# Patient Record
Sex: Female | Born: 1980 | Race: White | Hispanic: No | Marital: Married | Smoking: Never smoker
Health system: Southern US, Community
[De-identification: ages and names within clinical notes are randomized; demographics above are authoritative.]

## PROBLEM LIST (undated history)

## (undated) DIAGNOSIS — O09529 Supervision of elderly multigravida, unspecified trimester: Secondary | ICD-10-CM

## (undated) HISTORY — DX: Supervision of elderly multigravida, unspecified trimester: O09.529

---

## 2011-03-29 ENCOUNTER — Inpatient Hospital Stay: Payer: Self-pay | Admitting: Obstetrics and Gynecology

## 2012-08-29 ENCOUNTER — Inpatient Hospital Stay: Payer: Self-pay | Admitting: Obstetrics and Gynecology

## 2012-08-29 LAB — CBC WITH DIFFERENTIAL/PLATELET
Eosinophil #: 0 10*3/uL (ref 0.0–0.7)
HCT: 40.4 % (ref 35.0–47.0)
HGB: 13.6 g/dL (ref 12.0–16.0)
Lymphocyte #: 1.9 10*3/uL (ref 1.0–3.6)
MCHC: 33.6 g/dL (ref 32.0–36.0)
MCV: 86 fL (ref 80–100)
Monocyte %: 5.4 %
Neutrophil #: 9.4 10*3/uL — ABNORMAL HIGH (ref 1.4–6.5)
Neutrophil %: 78.3 %
RBC: 4.73 10*6/uL (ref 3.80–5.20)
RDW: 16.1 % — ABNORMAL HIGH (ref 11.5–14.5)
WBC: 12 10*3/uL — ABNORMAL HIGH (ref 3.6–11.0)

## 2012-08-30 LAB — HEMATOCRIT: HCT: 35.1 % (ref 35.0–47.0)

## 2012-11-06 ENCOUNTER — Emergency Department: Payer: Self-pay | Admitting: Emergency Medicine

## 2012-11-06 LAB — URINALYSIS, COMPLETE
Bilirubin,UR: NEGATIVE
Glucose,UR: NEGATIVE mg/dL (ref 0–75)
Ketone: NEGATIVE
Nitrite: NEGATIVE
Ph: 7 (ref 4.5–8.0)
Specific Gravity: 1.023 (ref 1.003–1.030)
Squamous Epithelial: <1
WBC UR: 20 /HPF (ref 0–5)

## 2012-11-06 LAB — COMPREHENSIVE METABOLIC PANEL
Albumin: 4 g/dL (ref 3.4–5.0)
Alkaline Phosphatase: 118 U/L (ref 50–136)
Anion Gap: 3 — ABNORMAL LOW (ref 7–16)
BUN: 18 mg/dL (ref 7–18)
Chloride: 109 mmol/L — ABNORMAL HIGH (ref 98–107)
Co2: 28 mmol/L (ref 21–32)
EGFR (African American): >60
Osmolality: 280 (ref 275–301)
Potassium: 3.8 mmol/L (ref 3.5–5.1)
SGOT(AST): 40 U/L — ABNORMAL HIGH (ref 15–37)
SGPT (ALT): 77 U/L (ref 12–78)
Sodium: 140 mmol/L (ref 136–145)
Total Protein: 7.8 g/dL (ref 6.4–8.2)

## 2012-11-06 LAB — CBC
HCT: 39.1 % (ref 35.0–47.0)
MCHC: 34.4 g/dL (ref 32.0–36.0)
MCV: 82 fL (ref 80–100)
Platelet: 215 10*3/uL (ref 150–440)
WBC: 6.4 10*3/uL (ref 3.6–11.0)

## 2012-11-06 LAB — CK: CK, Total: 121 U/L (ref 21–215)

## 2015-07-10 HISTORY — PX: WISDOM TOOTH EXTRACTION: SHX21

## 2016-08-27 DIAGNOSIS — R0789 Other chest pain: Secondary | ICD-10-CM | POA: Diagnosis not present

## 2016-08-27 DIAGNOSIS — I10 Essential (primary) hypertension: Secondary | ICD-10-CM | POA: Diagnosis not present

## 2016-08-27 DIAGNOSIS — R202 Paresthesia of skin: Secondary | ICD-10-CM | POA: Diagnosis not present

## 2016-08-27 DIAGNOSIS — Z79899 Other long term (current) drug therapy: Secondary | ICD-10-CM | POA: Diagnosis not present

## 2018-05-19 NOTE — Progress Notes (Signed)
New Obstetric Patient H&P    Chief Complaint: "Desires prenatal care"   History of Present Illness: Patient is a 37 y.o. G3 P2002 White/ Caucasianfemale, with unsure LMP 04/10/2018 presents with amenorrhea and positive home pregnancy test. Based on her  LMP, her EDD is 01/14/2018 and her EGA is 5 weeks 5 days. Cycles are 7 days long, regular, and occur approximately every : 28-30 days. Her last pap smear was 5 or 6 years ago and was negative.   She had a urine pregnancy test which was positive 4 day(s) ago (Oct 8). Her last menstrual period was normal and lasted for  7 day(s). Since her LMP she claims she has experienced nausea. She had some spotting the week before her positive pregnancy test. Her past medical history is uncomplicated. Her prior pregnancies are notable for two spontaneous vaginal deliveries. Pelvis proven to 9#2oz.  Since her LMP, she admits to the use of tobacco products  no She claims she has gained   three pounds since the start of her pregnancy.  There are cats in the home in the home  no If yes NA She admits close contact with children on a regular basis  yes  She has had chicken pox in the past yes She has had Tuberculosis exposures, symptoms, or previously tested positive for TB   no Current or past history of domestic violence. no  Genetic Screening/Teratology Counseling: (Includes patient, baby's father, or anyone in either family with:)   1. Patient's age >/= 67 at [facility]  yes 2. Thalassemia ([place], [place], Mediterranean, or Asian background): MCV<80  no 3. Neural tube defect (meningomyelocele, spina bifida, anencephaly)  no 4. Congenital heart defect  no  5. Down syndrome  no 6. Tay-Sachs (Jewish, [place]  no 7. Canavan's Disease  no 8. Sickle cell disease or trait (African)  no  9. Hemophilia or other blood disorders  no  10. Muscular dystrophy  no  11. Cystic fibrosis  no  12. Huntington's Chorea  no  13. Mental retardation/autism   no 14. Other inherited genetic or chromosomal disorder  no 15. Maternal metabolic disorder (DM, PKU, etc)  no 16. Patient or FOB with a child with a birth defect not listed above no  16a. Patient or FOB with a birth defect themselves no 17. Recurrent pregnancy loss, or stillbirth  no  18. Any medications since LMP other than prenatal vitamins (include vitamins, supplements, OTC meds, drugs, alcohol)  Yes, Juice Plus 19. Any other genetic/environmental exposure to discuss  no  Infection History:   1. Lives with someone with TB or TB exposed  no  2. Patient or partner has history of genital herpes  no 3. Rash or viral illness since LMP  no 4. History of STI (GC, CT, HPV, syphilis, HIV)  no 5. History of recent travel :  Yes, in [place]: [place], [place], [place], [place], [place]  Other pertinent information:  Yes,  Husband Reuel Boom is age 5 and works in [employer] He has twin brothers    Review of Systems:10 point review of systems negative unless otherwise noted in HPI  Past Medical History:  Past Medical History:  Diagnosis Date  . Advanced maternal age in multigravida     Past Surgical History:  Past Surgical History:  Procedure Laterality Date  . WISDOM TOOTH EXTRACTION  2017   All four;      Gynecologic History: Patient's last menstrual period was 04/10/2018 (approximate).  Obstetric History:  OB History  Gravida Para Term Preterm AB Living  3 2 2     2   SAB TAB Ectopic Multiple Live Births          2    # Outcome Date GA Lbr Len/2nd Weight Sex Delivery Anes PTL Lv  3 Current           2 Term 08/29/12 [redacted]w[redacted]d  9 lb 2 oz (4.139 kg) M Vag-Spont   LIV  1 Term 03/30/11 [redacted]w[redacted]d  7 lb 12 oz (3.515 kg) F Vag-Spont   LIV    Family History:  Family History  Problem Relation Age of Onset  . Diabetes Father   . Heart disease Father   . Heart disease Paternal Grandfather     Social History:  Social History   Socioeconomic History  . Marital status: Married    Spouse  name: Not on file  . Number of children: 2  . Years of education: 46  . Highest education level: Not on file  Occupational History  . Occupation: Artist  . Occupation: Building services engineer in [place]  Social Needs  . Financial resource strain: Not on file  . Food insecurity:    Worry: Not on file    Inability: Not on file  . Transportation needs:    Medical: Not on file    Non-medical: Not on file  Tobacco Use  . Smoking status: Never Smoker  . Smokeless tobacco: Never Used  Substance and Sexual Activity  . Alcohol use: Not Currently    Comment: occ wine  . Drug use: Never  . Sexual activity: Yes    Birth control/protection: None  Lifestyle  . Physical activity:    Days per week: Not on file    Minutes per session: Not on file  . Stress: Not on file  Relationships  . Social connections:    Talks on phone: Not on file    Gets together: Not on file    Attends religious service: Not on file    Active member of club or organization: Not on file    Attends meetings of clubs or organizations: Not on file    Relationship status: Not on file  . Intimate partner violence:    Fear of current or ex partner: Not on file    Emotionally abused: Not on file    Physically abused: Not on file    Forced sexual activity: Not on file  Other Topics Concern  . Not on file  Social History Narrative  . Not on file    Allergies:  No Known Allergies  Medications: Juice Plus supplements Physical Exam Vitals: BP 100/70   Ht 5\' 8"  (1.727 m)   Wt 167 lb 12 oz (76.1 kg)   LMP 04/10/2018 (Approximate)   BMI 25.51 kg/m   General: WF in NAD HEENT: normocephalic, anicteric  Eyes: PEARL  Mouth: moist, no lesions  Throat: no exudates or inflammation Thyroid: no enlargement, no palpable nodules Pulmonary: No increased work of breathing, CTAB Cardiovascular: RRR, without murmur Abdomen: soft, non-tender, non-distended.  Umbilicus without lesions.  No hepatomegaly or masses  palpable. No evidence of hernia  Genitourinary:  External: Normal external female genitalia.  Normal urethral meatus, normal Bartholin's and Skene's glands.    Vagina: Normal vaginal mucosa, no evidence of prolapse.    Cervix: Grossly normal in appearance, no bleeding  Uterus: AF, globular fundus, mobile, normal contour.  Adnexa: ovaries non-enlarged, no adnexal masses  Rectal: deferred Extremities: no  edema, erythema, or tenderness Neurologic: Grossly intact Psychiatric: mood appropriate, affect full   Assessment: 37 y.o. G3 P2002 presents to initiate prenatal care Early pregnancy at 5wk5d by LMP AMA  Plan: 1) Avoid alcoholic beverages. 2) Patient encouraged not to smoke.  3) Discontinue the use of all non-medicinal drugs and chemicals.  4) Can continue to take Juice Plus supplements  5) Nutrition, food safety (fish, cheese advisories, and high nitrite foods) and exercise discussed. Handouts given on genetic testing, safe medications in pregnancy, diet and food safety.  6) Hospital and practice style discussed with cross coverage system.  7) Her AMA increased risk of chromosomal abnormalities explained. Genetic Screening, such as with 1st Trimester Screening, cell free fetal DNA, AFP testing, and Ultrasound, as well as with amniocentesis, is discussed with patient. At the conclusion of today's visit patient is undecided about genetic testing 8) Patient is asked about travel to areas at risk for the Zika virus, and she has not traveled to high risk area. 9) RTO in 1-2 weeks for ultrasound for dating. 10) NOB labs, urine culture, UDS 7, Pap smear with [facility]ptima today.  Farrel Conners, CNM

## 2018-05-20 ENCOUNTER — Ambulatory Visit (INDEPENDENT_AMBULATORY_CARE_PROVIDER_SITE_OTHER): Payer: BLUE CROSS/BLUE SHIELD | Admitting: Certified Nurse Midwife

## 2018-05-20 ENCOUNTER — Other Ambulatory Visit (HOSPITAL_COMMUNITY)
Admission: RE | Admit: 2018-05-20 | Discharge: 2018-05-20 | Disposition: A | Discharge status: Home / Self Care | Payer: BLUE CROSS/BLUE SHIELD | Source: Ambulatory Visit | Attending: Certified Nurse Midwife | Admitting: Certified Nurse Midwife

## 2018-05-20 ENCOUNTER — Encounter: Payer: Self-pay | Admitting: Certified Nurse Midwife

## 2018-05-20 VITALS — BP 100/70 | Ht 68.0 in | Wt 167.8 lb

## 2018-05-20 DIAGNOSIS — O0991 Supervision of high risk pregnancy, unspecified, first trimester: Secondary | ICD-10-CM | POA: Diagnosis not present

## 2018-05-20 DIAGNOSIS — Z3A12 12 weeks gestation of pregnancy: Secondary | ICD-10-CM | POA: Diagnosis not present

## 2018-05-20 DIAGNOSIS — Z113 Encounter for screening for infections with a predominantly sexual mode of transmission: Secondary | ICD-10-CM | POA: Insufficient documentation

## 2018-05-20 DIAGNOSIS — Z3A01 Less than 8 weeks gestation of pregnancy: Secondary | ICD-10-CM

## 2018-05-20 DIAGNOSIS — O09521 Supervision of elderly multigravida, first trimester: Secondary | ICD-10-CM

## 2018-05-20 DIAGNOSIS — Z124 Encounter for screening for malignant neoplasm of cervix: Secondary | ICD-10-CM | POA: Insufficient documentation

## 2018-05-20 DIAGNOSIS — O0990 Supervision of high risk pregnancy, unspecified, unspecified trimester: Secondary | ICD-10-CM

## 2018-05-20 DIAGNOSIS — O09529 Supervision of elderly multigravida, unspecified trimester: Secondary | ICD-10-CM | POA: Diagnosis not present

## 2018-05-20 DIAGNOSIS — Z3687 Encounter for antenatal screening for uncertain dates: Secondary | ICD-10-CM

## 2018-05-20 LAB — POCT URINALYSIS DIPSTICK OB
GLUCOSE, UA: NEGATIVE
POC,PROTEIN,UA: NEGATIVE

## 2018-05-20 NOTE — Progress Notes (Signed)
C/O last week had a brownish something as big as the end of a thumb - ?tissue or part of tampon?Ernest Mallick.rj

## 2018-05-21 ENCOUNTER — Encounter: Payer: Self-pay | Admitting: Certified Nurse Midwife

## 2018-05-21 DIAGNOSIS — O09529 Supervision of elderly multigravida, unspecified trimester: Secondary | ICD-10-CM | POA: Insufficient documentation

## 2018-05-21 DIAGNOSIS — O0990 Supervision of high risk pregnancy, unspecified, unspecified trimester: Secondary | ICD-10-CM | POA: Insufficient documentation

## 2018-05-21 LAB — URINE DRUG PANEL 7
Amphetamines, Urine: NEGATIVE ng/mL
Barbiturate Quant, Ur: NEGATIVE ng/mL
Benzodiazepine Quant, Ur: NEGATIVE ng/mL
Cannabinoid Quant, Ur: NEGATIVE ng/mL
Cocaine (Metab.): NEGATIVE ng/mL
Opiate Quant, Ur: NEGATIVE ng/mL
PCP QUANT UR: NEGATIVE ng/mL

## 2018-05-21 LAB — RPR+RH+ABO+RUB AB+AB SCR+CB...
ANTIBODY SCREEN: NEGATIVE
HIV Screen 4th Generation wRfx: NONREACTIVE
Hematocrit: 44.1 % (ref 34.0–46.6)
Hemoglobin: 14.5 g/dL (ref 11.1–15.9)
Hepatitis B Surface Ag: NEGATIVE
MCH: 29.5 pg (ref 26.6–33.0)
MCHC: 32.9 g/dL (ref 31.5–35.7)
MCV: 90 fL (ref 79–97)
Platelets: 229 10*3/uL (ref 150–450)
RBC: 4.91 x10E6/uL (ref 3.77–5.28)
RDW: 12.3 % (ref 12.3–15.4)
RPR Ser Ql: NONREACTIVE
RUBELLA: 1.98 {index} (ref >0.99)
Rh Factor: POSITIVE
Varicella zoster IgG: 233 index (ref >165)
WBC: 6.3 10*3/uL (ref 3.4–10.8)

## 2018-05-22 LAB — CYTOLOGY - PAP
ADEQUACY: ABSENT
Chlamydia: NEGATIVE
DIAGNOSIS: NEGATIVE
HPV (WINDOPATH): NOT DETECTED
Neisseria Gonorrhea: NEGATIVE

## 2018-05-22 LAB — URINE CULTURE

## 2018-06-04 ENCOUNTER — Ambulatory Visit (INDEPENDENT_AMBULATORY_CARE_PROVIDER_SITE_OTHER): Payer: BLUE CROSS/BLUE SHIELD

## 2018-06-04 ENCOUNTER — Encounter: Payer: Self-pay | Admitting: Advanced Practice Midwife

## 2018-06-04 ENCOUNTER — Ambulatory Visit (INDEPENDENT_AMBULATORY_CARE_PROVIDER_SITE_OTHER): Payer: BLUE CROSS/BLUE SHIELD | Admitting: Advanced Practice Midwife

## 2018-06-04 VITALS — BP 108/70 | Wt 169.0 lb

## 2018-06-04 DIAGNOSIS — Z3A08 8 weeks gestation of pregnancy: Secondary | ICD-10-CM | POA: Diagnosis not present

## 2018-06-04 DIAGNOSIS — N8312 Corpus luteum cyst of left ovary: Secondary | ICD-10-CM | POA: Diagnosis not present

## 2018-06-04 DIAGNOSIS — Z3687 Encounter for antenatal screening for uncertain dates: Secondary | ICD-10-CM

## 2018-06-04 DIAGNOSIS — O09529 Supervision of elderly multigravida, unspecified trimester: Secondary | ICD-10-CM

## 2018-06-04 DIAGNOSIS — O0990 Supervision of high risk pregnancy, unspecified, unspecified trimester: Secondary | ICD-10-CM

## 2018-06-04 DIAGNOSIS — O09521 Supervision of elderly multigravida, first trimester: Secondary | ICD-10-CM

## 2018-06-04 DIAGNOSIS — O3411 Maternal care for benign tumor of corpus uteri, first trimester: Secondary | ICD-10-CM | POA: Diagnosis not present

## 2018-06-04 DIAGNOSIS — Z3A01 Less than 8 weeks gestation of pregnancy: Secondary | ICD-10-CM

## 2018-06-04 LAB — POCT URINALYSIS DIPSTICK OB
GLUCOSE, UA: NEGATIVE
POC,PROTEIN,UA: NEGATIVE

## 2018-06-04 NOTE — Progress Notes (Signed)
  Routine Prenatal Care Visit  Subjective  Karla Moody is a 37 y.o. G3P2002 at 7114w4d being seen today for ongoing prenatal care.  She is currently monitored for the following issues for this high-risk pregnancy and has Advanced maternal age in multigravida and Supervision of high risk pregnancy, antepartum on their problem list.  ----------------------------------------------------------------------------------- Patient reports no complaints.    . Vag. Bleeding: None.   . Denies leaking of fluid.  ----------------------------------------------------------------------------------- The following portions of the patient's history were reviewed and updated as appropriate: allergies, current medications, past family history, past medical history, past social history, past surgical history and problem list. Problem list updated.   Objective  Blood pressure 108/70, weight 169 lb (76.7 kg), last menstrual period 04/10/2018. Pregravid weight 160 lb (72.6 kg) Total Weight Gain 9 lb (4.082 kg) Urinalysis: Urine Protein Negative  Urine Glucose Negative  Fetal Status: Fetal Heart Rate (bpm): 196         Dating scan today 8 weeks 4 days. Adjust EDD to ultrasound dating for uncertain LMP  General:  Alert, oriented and cooperative. Patient is in no acute distress.  Skin: Skin is warm and dry. No rash noted.   Cardiovascular: Normal heart rate noted  Respiratory: Normal respiratory effort, no problems with respiration noted  Abdomen: Soft, gravid, appropriate for gestational age.       Pelvic:  Cervical exam deferred        Extremities: Normal range of motion.     Mental Status: Normal mood and affect. Normal behavior. Normal judgment and thought content.   Assessment   37 y.o. [id number] at 8014w4d by  01/10/2019, by Ultrasound presenting for routine prenatal visit  Plan   THIRD Problems (from 05/20/18 to present)    No problems associated with this episode.       Preterm labor symptoms and  general obstetric precautions including but not limited to vaginal bleeding, contractions, leaking of fluid and fetal movement were reviewed in detail with the patient.    Return in about 4 weeks (around 07/02/2018) for 1st trimester screen/NT scan/rob.  Tresea MallJane Kwasi Moody, CNM 06/04/2018 11:48 AM

## 2018-06-04 NOTE — Progress Notes (Signed)
ROB/Dating C/o some nausea   

## 2018-06-23 ENCOUNTER — Ambulatory Visit (INDEPENDENT_AMBULATORY_CARE_PROVIDER_SITE_OTHER): Payer: BLUE CROSS/BLUE SHIELD | Admitting: Obstetrics & Gynecology

## 2018-06-23 ENCOUNTER — Encounter: Payer: Self-pay | Admitting: Obstetrics & Gynecology

## 2018-06-23 VITALS — BP 110/80 | Wt 167.0 lb

## 2018-06-23 DIAGNOSIS — O209 Hemorrhage in early pregnancy, unspecified: Secondary | ICD-10-CM

## 2018-06-23 NOTE — Progress Notes (Signed)
Obstetric Problem Visit   Chief Complaint:  Chief Complaint  Patient presents with  . Vaginal Discharge    History of Present Illness: Patient is a 37 y.o. [id number] 4087w2d presenting for first trimester bleeding.  The onset of bleeding was one week ago and it has been more brown discharge than red.  Mild cramping.  SHe is 11 weeks by earlier US dating.  Third pregnancy, no prior h/o PTL, bleeding, other.  Is bleeding equal to or greater than normal menstrual flow:  No Any recent trauma:  No Recent intercourse:  No History of prior miscarriage:  No Prior ultrasound demonstrating IUP:  Yes Prior ultrasound demonstrating viable IUP:  Yes Prior Serum HCG:  No Rh status: O+  PMHx: She  has a past medical history of Advanced maternal age in multigravida. Also,  has a past surgical history that includes Wisdom tooth extraction (2017)., family history includes Diabetes in her father; Heart disease in her father and paternal grandfather; Multiple sclerosis in her paternal aunt.,  reports that she has never smoked. She has never used smokeless tobacco. She reports previous alcohol use. She reports that she does not use drugs.  She has a current medication list which includes the following prescription(s): nutritional supplements. Also, has No Known Allergies.  Review of Systems  Constitutional: Negative for chills, fever and malaise/fatigue.  HENT: Negative for congestion, sinus pain and sore throat.   Eyes: Negative for blurred vision and pain.  Respiratory: Negative for cough and wheezing.   Cardiovascular: Negative for chest pain and leg swelling.  Gastrointestinal: Negative for abdominal pain, constipation, diarrhea, heartburn, nausea and vomiting.  Genitourinary: Negative for dysuria, frequency, hematuria and urgency.  Musculoskeletal: Negative for back pain, joint pain, myalgias and neck pain.  Skin: Negative for itching and rash.  Neurological: Negative for dizziness, tremors and  weakness.  Endo/Heme/Allergies: Does not bruise/bleed easily.  Psychiatric/Behavioral: Negative for depression. The patient is not nervous/anxious and does not have insomnia.     Objective: Vitals:   06/23/18 1521  BP: 110/80   Physical Exam Constitutional:      General: She is not in acute distress.    Appearance: She is well-developed.  Genitourinary:     Pelvic exam was performed with patient supine.     Vagina and uterus normal.     No vaginal erythema or bleeding.     No cervical motion tenderness, discharge, polyp or nabothian cyst.     Uterus is mobile.     Uterus is not enlarged.     No uterine mass detected.    Uterus is midaxial.     No right or left adnexal mass present.     Right adnexa not tender.     Left adnexa not tender.     Genitourinary Comments: Brown discharge, c/w old blood not infection Cervix closed, thick    No lesions Uterus enlarged and anteverted  HENT:     Head: Normocephalic and atraumatic.     Nose: Nose normal.  Abdominal:     General: There is no distension.     Palpations: Abdomen is soft.     Tenderness: There is no abdominal tenderness.  Musculoskeletal: Normal range of motion.  Neurological:     Mental Status: She is alert and oriented to person, place, and time.     Cranial Nerves: No cranial nerve deficit.  Skin:    General: Skin is warm and dry.   US- +FHT 160s   No funnelling  Breech, singleton   No mass  Assessment & Plan: 37 y.o. [id number] [redacted]w[redacted]d Problem List Items Addressed This Visit      Other   First trimester bleeding - Primary    More brown than bleeding, no sign of infection or other etiology Exam reassuring, cervix closed [place] (by self) w FHT 150s and no funnelling, good FM, singleton  The patient is Rh O+, rhogam is therefore not indicated to decrease the risk rhesus alloimmunization.    Routine bleeding precautions were discussed with the patient prior the conclusion of today's visit.  Annamarie Major, MD,  [facility], [facility] 06/23/2018  4:08 PM

## 2018-07-04 ENCOUNTER — Ambulatory Visit (INDEPENDENT_AMBULATORY_CARE_PROVIDER_SITE_OTHER): Payer: BLUE CROSS/BLUE SHIELD | Admitting: Advanced Practice Midwife

## 2018-07-04 ENCOUNTER — Ambulatory Visit (INDEPENDENT_AMBULATORY_CARE_PROVIDER_SITE_OTHER): Payer: BLUE CROSS/BLUE SHIELD

## 2018-07-04 ENCOUNTER — Encounter: Payer: Self-pay | Admitting: Advanced Practice Midwife

## 2018-07-04 VITALS — BP 114/70 | Wt 168.0 lb

## 2018-07-04 DIAGNOSIS — O0990 Supervision of high risk pregnancy, unspecified, unspecified trimester: Secondary | ICD-10-CM

## 2018-07-04 DIAGNOSIS — Z3682 Encounter for antenatal screening for nuchal translucency: Secondary | ICD-10-CM

## 2018-07-04 DIAGNOSIS — O09529 Supervision of elderly multigravida, unspecified trimester: Secondary | ICD-10-CM | POA: Diagnosis not present

## 2018-07-04 DIAGNOSIS — O09521 Supervision of elderly multigravida, first trimester: Secondary | ICD-10-CM

## 2018-07-04 DIAGNOSIS — O0991 Supervision of high risk pregnancy, unspecified, first trimester: Secondary | ICD-10-CM

## 2018-07-04 DIAGNOSIS — Z3A12 12 weeks gestation of pregnancy: Secondary | ICD-10-CM

## 2018-07-04 NOTE — Progress Notes (Signed)
NT screen. No vb. No lof.

## 2018-07-04 NOTE — Patient Instructions (Signed)

## 2018-07-04 NOTE — Progress Notes (Signed)
  Routine Prenatal Care Visit  Subjective  Karla Moody is a 37 y.o. G3P2002 at 2275w6d being seen today for ongoing prenatal care.  She is currently monitored for the following issues for this high-risk pregnancy and has Advanced maternal age in multigravida; Supervision of high risk pregnancy, antepartum; and First trimester bleeding on their problem list.  ----------------------------------------------------------------------------------- Patient reports no complaints.    . Vag. Bleeding: None.   . Denies leaking of fluid.  ----------------------------------------------------------------------------------- The following portions of the patient's history were reviewed and updated as appropriate: allergies, current medications, past family history, past medical history, past social history, past surgical history and problem list. Problem list updated.   Objective  Blood pressure 114/70, weight 168 lb (76.2 kg), last menstrual period 04/10/2018. Pregravid weight 160 lb (72.6 kg) Total Weight Gain 8 lb (3.629 kg) Urinalysis: Urine Protein    Urine Glucose    Fetal Status: Fetal Heart Rate (bpm): 154          ULTRASOUND REPORT  Location: [facility]        Date of Service: 07/04/2018  Patient Name: Karla Moody DOB: Apr 11, 1981 MRN: [id number]  Indications:First Trimester Screen - NT Findings:  Karla Moody intrauterine pregnancy is visualized with a CRL consistent with 3336w3d gestation, giving an (U/S) EDD of 01/13/2019. The (U/S) EDD is consistent with the clinically established Estimated Date of Delivery: 01/10/19.  FHR: 154 BPM CRL measurement: 58.8 mm NT measurement: unable to be assessed due to fetal position but thick area around the fetus neck is observed. Yolk sac is not seen. Amnion is visualized.  Nasal Bone is not seen due to fetal position  Right Ovary is normal in appearance. Left Ovary is normal in appearance. Survey of the adnexa demonstrates no adnexal  masses. There is no free peritoneal fluid in the cul de sac.  Impression: 1. 7136w3d viable Singleton Intrauterine pregnancy by U/S. 2. (U/S) EDD is consistent with Clinically established Estimated Date of Delivery: 01/10/19. 3. NT Screen is not successfully completed  but thick area around the fetus neck is observed.  Recommendations: 1.Clinical correlation with the patient's History and Physical Exam. 2. NT screening in 4 to 5 days.  Mital bahen Leodis BinetP Patel, RDMS  General:  Alert, oriented and cooperative. Patient is in no acute distress.  Skin: Skin is warm and dry. No rash noted.   Cardiovascular: Normal heart rate noted  Respiratory: Normal respiratory effort, no problems with respiration noted  Abdomen: Soft, gravid, appropriate for gestational age. Pain/Pressure: Absent     Pelvic:  Cervical exam deferred        Extremities: Normal range of motion.     Mental Status: Normal mood and affect. Normal behavior. Normal judgment and thought content.   Assessment   37 y.o. [id number] at 3975w6d by  01/10/2019, by Ultrasound presenting for routine prenatal visit  Plan   THIRD Problems (from 05/20/18 to present)    No problems associated with this episode.    MaterniT21 done today   Preterm labor symptoms and general obstetric precautions including but not limited to vaginal bleeding, contractions, leaking of fluid and fetal movement were reviewed in detail with the patient. Please refer to After Visit Summary for other counseling recommendations.   Return in about 4 weeks (around 08/01/2018) for rob.  Karla Moody, CNM 07/04/2018 9:52 AM

## 2018-07-10 LAB — MATERNIT 21 PLUS CORE, BLOOD
CHROMOSOME 13: POSITIVE — AB
CHROMOSOME 18: NEGATIVE
CHROMOSOME 21: NEGATIVE
Fetal Fraction: 6
Y Chromosome: NOT DETECTED

## 2018-07-15 ENCOUNTER — Other Ambulatory Visit: Payer: Self-pay | Admitting: Advanced Practice Midwife

## 2018-07-15 DIAGNOSIS — O285 Abnormal chromosomal and genetic finding on antenatal screening of mother: Secondary | ICD-10-CM

## 2018-07-16 ENCOUNTER — Emergency Department: Payer: BLUE CROSS/BLUE SHIELD

## 2018-07-16 ENCOUNTER — Emergency Department
Admission: EM | Admit: 2018-07-16 | Discharge: 2018-07-17 | Disposition: A | Discharge status: Home / Self Care | Payer: BLUE CROSS/BLUE SHIELD | Attending: Emergency Medicine | Admitting: Emergency Medicine

## 2018-07-16 ENCOUNTER — Encounter: Payer: Self-pay | Admitting: Emergency Medicine

## 2018-07-16 DIAGNOSIS — O4691 Antepartum hemorrhage, unspecified, first trimester: Secondary | ICD-10-CM | POA: Insufficient documentation

## 2018-07-16 DIAGNOSIS — O208 Other hemorrhage in early pregnancy: Secondary | ICD-10-CM | POA: Diagnosis not present

## 2018-07-16 DIAGNOSIS — Z3A11 11 weeks gestation of pregnancy: Secondary | ICD-10-CM | POA: Diagnosis not present

## 2018-07-16 DIAGNOSIS — N939 Abnormal uterine and vaginal bleeding, unspecified: Secondary | ICD-10-CM

## 2018-07-16 DIAGNOSIS — O26891 Other specified pregnancy related conditions, first trimester: Secondary | ICD-10-CM | POA: Diagnosis not present

## 2018-07-16 DIAGNOSIS — O209 Hemorrhage in early pregnancy, unspecified: Secondary | ICD-10-CM | POA: Diagnosis not present

## 2018-07-16 DIAGNOSIS — O4690 Antepartum hemorrhage, unspecified, unspecified trimester: Secondary | ICD-10-CM

## 2018-07-16 LAB — CBC WITH DIFFERENTIAL/PLATELET
Abs Immature Granulocytes: 0.05 10*3/uL (ref 0.00–0.07)
Basophils Absolute: 0 10*3/uL (ref 0.0–0.1)
Basophils Relative: 0 %
Eosinophils Absolute: 0 10*3/uL (ref 0.0–0.5)
Eosinophils Relative: 0 %
HEMATOCRIT: 44.1 % (ref 36.0–46.0)
Hemoglobin: 14.7 g/dL (ref 12.0–15.0)
Immature Granulocytes: 0 %
Lymphocytes Relative: 17 %
Lymphs Abs: 2.1 10*3/uL (ref 0.7–4.0)
MCH: 29.1 pg (ref 26.0–34.0)
MCHC: 33.3 g/dL (ref 30.0–36.0)
MCV: 87.2 fL (ref 80.0–100.0)
Monocytes Absolute: 0.6 10*3/uL (ref 0.1–1.0)
Monocytes Relative: 5 %
NEUTROS PCT: 78 %
Neutro Abs: 9.2 10*3/uL — ABNORMAL HIGH (ref 1.7–7.7)
Platelets: 248 10*3/uL (ref 150–400)
RBC: 5.06 MIL/uL (ref 3.87–5.11)
RDW: 12.9 % (ref 11.5–15.5)
WBC: 12 10*3/uL — ABNORMAL HIGH (ref 4.0–10.5)
nRBC: 0 % (ref 0.0–0.2)

## 2018-07-16 LAB — URINALYSIS, COMPLETE (UACMP) WITH MICROSCOPIC
Bacteria, UA: NONE SEEN
Bilirubin Urine: NEGATIVE
Glucose, UA: NEGATIVE mg/dL
Hgb urine dipstick: NEGATIVE
Ketones, ur: 80 mg/dL — AB
Leukocytes, UA: NEGATIVE
Nitrite: NEGATIVE
Protein, ur: NEGATIVE mg/dL
Specific Gravity, Urine: 1.021 (ref 1.005–1.030)
pH: 6 (ref 5.0–8.0)

## 2018-07-16 LAB — HCG, QUANTITATIVE, PREGNANCY: hCG, Beta Chain, Quant, S: 5284 m[IU]/mL — ABNORMAL HIGH (ref <5)

## 2018-07-16 LAB — CHLAMYDIA/NGC RT PCR (ARMC ONLY)
CHLAMYDIA TR: NOT DETECTED
N gonorrhoeae: NOT DETECTED

## 2018-07-16 LAB — WET PREP, GENITAL
Clue Cells Wet Prep HPF POC: NONE SEEN
Sperm: NONE SEEN
Trich, Wet Prep: NONE SEEN
Yeast Wet Prep HPF POC: NONE SEEN

## 2018-07-16 NOTE — ED Triage Notes (Addendum)
Patient presents to the ED with lower abdominal cramping that began at 5:30pm and patient states when she went to the bathroom she noticed some vaginal bleeding and a small blood clot.  Patient states bleeding has subsided some.  Patient is tearful during triage.  This is patient's 3rd pregnancy.  Full term natural deliveries without complications for the first 2 pregnancies.

## 2018-07-16 NOTE — ED Provider Notes (Signed)
[facility] Emergency Department Provider Note  ____________________________________________   First MD Initiated Contact with Patient 07/16/18 2111     (approximate)  I have reviewed the triage vital signs and the nursing notes.   HISTORY  Chief Complaint Vaginal Bleeding    HPI Karla Moody is a 38 y.o. female with first trimester bleeding this past December who was presented emergency department at [redacted] weeks gestation with bright red blood per vagina as well as lower abdominal cramping since approximately 5:15 PM this afternoon.  She says that the cramping at this time is a 3-4 out of 10.  States that she had clots earlier but now is just having liquid blood.  Patient is a G3 P2 without issue with her previous pregnancies.  Has had 2 previous vaginal deliveries.   Past Medical History:  Diagnosis Date  . Advanced maternal age in multigravida     Patient Active Problem List   Diagnosis Date Noted  . First trimester bleeding 06/23/2018  . Supervision of high risk pregnancy, antepartum 05/21/2018  . Advanced maternal age in multigravida     Past Surgical History:  Procedure Laterality Date  . WISDOM TOOTH EXTRACTION  2017   All four;      Prior to Admission medications   Medication Sig Start Date End Date Taking? Authorizing Provider  Nutritional Supplements (JUICE PLUS FIBRE PO) Take by mouth.    [provider]    Allergies Patient has no known allergies.  Family History  Problem Relation Age of Onset  . Diabetes Father   . Heart disease Father        sudden death  . Heart disease Paternal Grandfather   . Multiple sclerosis Paternal Aunt     Social History Social History   Tobacco Use  . Smoking status: Never Smoker  . Smokeless tobacco: Never Used  Substance Use Topics  . Alcohol use: Not Currently    Comment: occ wine  . Drug use: Never    Review of Systems  Constitutional: No fever/chills Eyes: No visual  changes. ENT: No sore throat. Cardiovascular: Denies chest pain. Respiratory: Denies shortness of breath. Gastrointestinal: No nausea, no vomiting.  No diarrhea.  No constipation. Genitourinary: Negative for dysuria. Musculoskeletal: Negative for back pain. Skin: Negative for rash. Neurological: Negative for headaches, focal weakness or numbness.   ____________________________________________   PHYSICAL EXAM:  VITAL SIGNS: ED Triage Vitals  Enc Vitals Group     BP 07/16/18 1830 133/84     Pulse Rate 07/16/18 1830 73     Resp 07/16/18 1830 20     Temp 07/16/18 1830 98.3 F (36.8 C)     Temp Source 07/16/18 1830 Oral     SpO2 07/16/18 1830 100 %     Weight 07/16/18 1830 161 lb (73 kg)     Height 07/16/18 1830 5\' 8"  (1.727 m)     Head Circumference --      Peak Flow --      Pain Score 07/16/18 1840 1     Pain Loc --      Pain Edu? --      Excl. in GC? --     Constitutional: Alert and oriented. Well appearing and in no acute distress. Eyes: Conjunctivae are normal.  Head: Atraumatic. Nose: No congestion/rhinnorhea. Mouth/Throat: Mucous membranes are moist.  Neck: No stridor.   Cardiovascular: Normal rate, regular rhythm. Grossly normal heart sounds.  Good peripheral circulation. Respiratory: Normal respiratory effort.  No  retractions. Lungs CTAB. Gastrointestinal: Soft and nontender. No distention. No CVA tenderness. Genitourinary: Normal external exam without any lesions.  Speculum exam with small amount of pooling dark maroon blood but without active bleeding.  Difficult bimanual exam.  The patient had told me that she was very far either anterior or posterior but could not remember which one.  I believe her cervix was very posterior.  The cervix was not effaced.  Because of the position of the cervix I was unable to palpate the OS.  Mild tenderness to palpation to the uterus as well as the bilateral adnexa. Musculoskeletal: No lower extremity tenderness nor edema.  No  joint effusions. Neurologic:  Normal speech and language. No gross focal neurologic deficits are appreciated. Skin:  Skin is warm, dry and intact. No rash noted. Psychiatric: Mood and affect are normal. Speech and behavior are normal.  ____________________________________________   LABS (all labs ordered are listed, but only abnormal results are displayed)  Labs Reviewed  WET PREP, GENITAL - Abnormal; Notable for the following components:      Result Value   WBC, Wet Prep HPF POC FEW (*)    All other components within normal limits  HCG, QUANTITATIVE, PREGNANCY - Abnormal; Notable for the following components:   hCG, Beta Chain, Quant, S 5,284 (*)    All other components within normal limits  CBC WITH DIFFERENTIAL/PLATELET - Abnormal; Notable for the following components:   WBC 12.0 (*)    Neutro Abs 9.2 (*)    All other components within normal limits  URINALYSIS, COMPLETE (UACMP) WITH MICROSCOPIC - Abnormal; Notable for the following components:   Color, Urine YELLOW (*)    APPearance HAZY (*)    Ketones, ur 80 (*)    All other components within normal limits  CHLAMYDIA/NGC RT PCR ([facility] ONLY)   ____________________________________________  EKG   ____________________________________________  RADIOLOGY  Pending ultrasound read ____________________________________________   PROCEDURES  Procedure(s) performed:   Procedures  Critical Care performed:   ____________________________________________   INITIAL IMPRESSION / ASSESSMENT AND PLAN / ED COURSE  Pertinent labs & imaging results that were available during my care of the patient were reviewed by me and considered in my medical decision making (see chart for details).  Differential diagnosis includes, but is not limited to, threatened miscarriage, incomplete miscarriage, normal bleeding from an early trimester pregnancy, ectopic pregnancy, , blighted ovum, vaginal/cervical trauma, subchorionic  hemorrhage/hematoma, etc. As part of my medical decision making, I reviewed the following data within the electronic MEDICAL RECORD NUMBER Notes from prior OB/GYN visits  ----------------------------------------- 11:17 PM on 07/16/2018 -----------------------------------------  Pending official ultrasound read at this time.  Patient did not report to me but had reported to the ultrasound tech that there was suspicion of trisomy 71.  Signed out to Dr. Manson Passey. ____________________________________________   FINAL CLINICAL IMPRESSION(S) / ED DIAGNOSES  Final diagnoses:  Vaginal bleeding  Second trimester vaginal bleeding.    NEW MEDICATIONS STARTED DURING THIS VISIT:  New Prescriptions   No medications on file     Note:  This document was prepared using Dragon voice recognition software and may include unintentional dictation errors.     Myrna Blazer, MD 07/16/18 [phone]

## 2018-07-16 NOTE — ED Notes (Signed)
This RN attempted to assess patient's fetal heart tones without success.  Also, patient states she has an O+ blood type.

## 2018-07-17 ENCOUNTER — Ambulatory Visit (INDEPENDENT_AMBULATORY_CARE_PROVIDER_SITE_OTHER): Payer: BLUE CROSS/BLUE SHIELD | Admitting: Obstetrics and Gynecology

## 2018-07-17 ENCOUNTER — Encounter: Payer: Self-pay | Admitting: Obstetrics and Gynecology

## 2018-07-17 VITALS — BP 100/60 | Wt 164.0 lb

## 2018-07-17 DIAGNOSIS — O09529 Supervision of elderly multigravida, unspecified trimester: Secondary | ICD-10-CM

## 2018-07-17 DIAGNOSIS — O021 Missed abortion: Secondary | ICD-10-CM

## 2018-07-17 NOTE — Progress Notes (Signed)
   Patient ID: Karla Moody, female   DOB: [birth year], 38 y.o.   MRN: [id number]  Reason for Consult: Follow-up (no bleeding or pain today, heavy bleeding with clots & cramps yesterday) and Vaginal Bleeding   Referred by No ref. provider found  Subjective:     HPI:  Karla Moody is a 38 y.o. female. She is following up today in the office because she was seen in the ER yesterday and diagnosed with a missed abortion. She recently had a positive Trisomy 13 screen. This was an add on problem visit scheduled this morning.   Past Medical History:  Diagnosis Date  . Advanced maternal age in multigravida    Family History  Problem Relation Age of Onset  . Diabetes Father   . Heart disease Father        sudden death  . Heart disease Paternal Grandfather   . Multiple sclerosis Paternal Aunt    Past Surgical History:  Procedure Laterality Date  . WISDOM TOOTH EXTRACTION  2017   All four;      Short Social History:  Social History   Tobacco Use  . Smoking status: Never Smoker  . Smokeless tobacco: Never Used  Substance Use Topics  . Alcohol use: Not Currently    Comment: occ wine    No Known Allergies  Current Outpatient Medications  Medication Sig Dispense Refill  . Nutritional Supplements (JUICE PLUS FIBRE PO) Take by mouth.     No current facility-administered medications for this visit.     REVIEW OF SYSTEMS      Objective:  Objective   Vitals:   07/17/18 1420  BP: 100/60  Weight: 164 lb (74.4 kg)   Body mass index is 24.94 kg/m.  Physical Exam Vitals signs and nursing note reviewed.  Constitutional:      Appearance: She is well-developed.  HENT:     Head: Normocephalic and atraumatic.  Eyes:     Pupils: Pupils are equal, round, and reactive to light.  Cardiovascular:     Rate and Rhythm: Normal rate and regular rhythm.  Pulmonary:     Effort: Pulmonary effort is normal. No respiratory distress.  Abdominal:     General: Abdomen is flat.   Palpations: Abdomen is soft.  Skin:    General: Skin is warm and dry.  Neurological:     Mental Status: She is alert and oriented to person, place, and time.  Psychiatric:        Behavior: Behavior normal.        Thought Content: Thought content normal.        Judgment: Judgment normal.        Assessment/Plan:    38 yo with missed abortion Expectant management of miscarriage at this time. Discussed risks benefits or medical and surgical management. Patient will consider and let us know when she decides. Discussed that there is no way to predict when she may start to miscarry.   Declines follow up next week at this time.  Has an appointment on the 24th, will call to schedule one sooner.  Rh +, no rhogam needed.   More than 25 minutes were spent face to face with the patient in the room with more than 50% of the time spent providing counseling and discussing the plan of management.    Adelene Idler MD [facility], [facility] 07/17/2018 6:15 PM

## 2018-07-17 NOTE — Patient Instructions (Signed)
Please accept our heartfelt condolences and call us if you need an ear to listen.  Resources: [email or web address] www.[email or web address] [email or web address] [email or web address] This site has an excellent pamphlet that you can download on early pregnancy loss.  Miscarriage: Women Sharing from the Heart by Lorre Munroe and Ellyn Hack. Published by Wyn[employer] Emery and [employer]. Summarizes reactions of 100 women who experience miscarriage. Molly's [place] by Baldemar Lenis. Published by Darlin Drop. Children's book on miscarriage. Miscarriage: A Man's Book by Philippa Sicks. Published by [employer] at [phone]. Written to help men understand reactions of both parents to miscarriage. Miscarriage: A Shattered Dream by Peri Maris and Lubertha Sayres. I Never Held You: A book about miscarriage, healing and recovery by Armen Pickup. [place].   MISCARRIAGE OR NEONATAL LOSS.a word to parents By Carmie End, MSW For those who experience a miscarriage or fetal loss, the world has turned upside down. Regardless of whether the pregnancy was planned and wanted, or unplanned and the parents were ambivalent, there are feelings of sadness, grief and a loss of equilibrium which effects both parents. Though we allow for these feelings when other deaths occur, we are remiss in acknowledging and allowing parents to grieve their pregnancy loss or miscarriage. We wrongly equate the depth of the loss to the size of the casket. With pregnancy loss or death before birth, there is little guidance or expectation of what is normal grief. Others, who minimize or misunderstand the loss, may expect that you "get back to normal" quickly. With pressures to overlook the impact of the loss, healthy grieving processes can get stunted, overlooked, or stopped all together. I hope that this handout will allow you to acknowledge and express whatever loss you feel, to share with others  your sorrow and will encourage you and others to take a moment to understand and experience the depth of your emotions at this time.  A baby represents so much. It may represent hope, immortality, fulfillment of dreams, or an outward sign of a loving relationship. At the same time, pregnancy may bring on anxiety, fear, and an increase in commitment and responsibility. Understandably, few pregnant parents experience only one or the other of these emotions. Despite the type of emotional response to the pregnancy, as the process continues, what is central to almost all pregnancies is that over time, the bond between parent and child grows. Each day brings an expansion of the world. What was once routine now becomes more important. Diet, lifestyle, connection to family, time commitments, relationships with friends, spouse, others.nothing is as it was. These are the normal changes that a pregnant woman and her partner experience. When a baby dies, nothing that made sense before makes any sense after. We are left emotionally raw. Often our bodies respond in ways that assure [place] that we are crazy, but we are not. We are grieving a life that we never fully knew, a relationship that was too short. A part of [place] has died too. Mothers and fathers become more attached to a pregnancy as each day, week and month go by. Mothers have physical contact with the pregnancy, daily reminders of the growth and change occurring. They connect by talking to their bellies, by wondering what life will be like this time next year, look for clothes, cribs, pediatricians and more. Fathers often become attached in different ways: considering buying a new car or safe car seat, working more to earn a little more money before the baby arrives, or by reconsidering the family's financial  status. However bonding occurs, it generally increases as the baby grows. For family members, co-workers and friends, the pregnancy may be less  real. They may only know of the pregnancy from a physical or emotional distance. Since many of us are so uncomfortable with death and we work hard to deny it. With a prenatal loss, the lack of contact with the unborn baby may encourage others to minimize the loss, or even suggest that it was something to feel thankful for at this time suggesting that the pain felt now would be significantly less than at any other time in relationship to this child. That is why it feels so crazy to be in such pain when others might not understand. You might feel that you are still standing at an emotional graveside and the world wonders "when you'll be ready to go back to work" or "why you still cry. It's been four months already." Be assured that it is the rest of the world that is, in this case, acting crazy. When a living child or adult dies, we have understandings, behaviors, rituals that though painful, enable us to feel cared for, comforted, acknowledged in our loss, and encourage healing. Unfortunately, too little of this is the case when there is a miscarriage or neonatal loss. There are many difficult dilemmas that you might face in the wake of the loss. Do you have a funeral? How do you tell people? Do you publish an obituary? What do you do with the remains, both physical and emotional? I encourage people to find a formalized way to acknowledge the loss of the pregnancy and to honor the relationship that did and does still exist and will for the rest of your life. It is your choice as to whether this happens in a funeral service, in a [employer]memorial service, in a letter to the child who you did not meet or in some manner that makes sense to you religiously, spiritually or emotionally. It is not silly to ask friends or family to participate nor is it improper to keep this intensely personal and private. Grief, however, needs to be a public process. We must grieve among others and with our loved ones. We do  not all need to feel the same amount of pain in the loss, but we need to act as a loving community to support those most hurt at this time. Healthy grieving involves a strengthening of the bonds with others who we love and who love us. Some people ask a close friend or relative to contact others and inform them of the loss to minimize the telling of the story. Others find relief in telling friends, family and colleagues about the loss, in that it reminds us that the pregnancy was real and that this pain is real. Where one connection is broken, another is reinforced and strengthened. Grieving in isolation can be detrimental. By inviting important others to a funeral, tree planting, or sunset service and by taking them up on an offer to help will help your healing. Some people have described aspects of grief after a pregnancy loss that are quite different from those present after the loss of an adult relative or friend. Some things that might seem "weird" or "abnormal" are indeed, quite normal following this kind of loss. Alberteen SpindleG.W. Davidson described her experience in the book "Understanding: Death of the Wished-for Child." "I kept searching for something. I wasn't sure what it was. All of a sudden one day in the kitchen, I spontaneously  got out my kitchen scales and started weighing fruits and vegetables. I realized I was trying to find something that had the identical weight that the baby did.I found myself weighing my rolling pin.it happened to be the identical length and weight that the baby was." This describes the process of building memories. With miscarriage and neonatal loss, there are too few memories of the pregnancy or the baby itself. Parents may not ever be able to see or hold a baby in these cases, especially in the earlier trimesters, so saving mementos and gathering and providing information are especially important. Alberteen SpindleG.W. Davidson described a parent who needs to see and feel  something that had similar dimensions as the child. This is profoundly different than the grieving that happens when a 37 year old person dies, when we have years of memories, concrete possessions, photos and history. Though it is a different aspect of grief, it is normal, expected and healthy. Your grief will not make you crazy. Also, do not be frightened if you feel this loss in a very physical manner. Your arms or chest may ache, convincing you that your heart has broken. Women have described this as a feeling of "empty arms". They also describe a similar feeling of an emptiness in their bellies, which was previously full. Some women also swear that they still feel the baby moving in their bellies. Many people report being woken to the sound of a crying baby, having vivid dreams about babies, trying to find their lost babies, or graphic dreams of injuries happening to themselves or others. Breasts may still feel as if they are filling and it may seem as if the body is unaware of the loss. These things can feel quite confusing. If you experience any of these responses, please tell someone about them. They are not a sign of insanity, they are aspects of grief. Keeping them to yourself increases the feelings of isolation and disequilibrium. Be reassured that these are normal experiences after the loss of a pregnancy. A WORD ABOUT DIFFERENCES BETWEEN MEN'S AND WOMEN'S GRIEF If most people do not fully understand the pain of a mother's grief through pregnancy loss, there is an even greater denial on society's part that the father is in pain. I have seen men in great pain and despair after the loss of a pregnancy and they say that nobody has ever asked them about their loss. People may only ask about their wife/partner's pain. To put it simply, one father, head bowed, shoulders heaving as he cried said "if one more person asks me how Erskine SquibbJane is, I'll fly apart. Don't they know I lost a child too?  Don't they see the circles under my eyes, my face, my pain? Don't I count?" Couples often move through the grief process in different ways and at different times. Do not assume that your partner is better because they are wanting to take in a movie or go out to eat. This may just be a diversion, a need for a change of scenery, and a yearning for normalcy. Women are usually more comfortable crying and men find safety in action. For this reason, friction can develop between partners, when one wants the other to start behaving like they did before the loss or to cry along with them. Respect one another's process. Talk to your partner about the specifics. If asked, "How are you today?" many people will just say "fine". Specifically relate that you have had a hard time with the holiday,  or seeing a friend's new baby, and ask your partner how they felt in that moment. Sex is very often a very difficult prospect after a miscarriage or loss. It might remind a person of the conception, raise fears of another pregnancy and future children, or seem like too much pleasure to experience when one is otherwise in pain. Many men reconnect with their partner by being sexual and feel increasingly isolated when a partner does not respond. Talk. There may be a comfortable middle ground with each other. If the general patterns of the relationship shift toward uncomfortable dynamics, such as decreased communication, blaming one another, or increased absences from the home, it is important to seek counseling with a professional. This is not an easy time, you have had a loss. Be patient with yourself. Seek help and support from loved ones, friends, professionals. The grief will evolve with time and you will not always be in such immediate or raw pain. Be assured that you may never forget your pregnancy or your baby. Healing takes time. I have compiled a list of books on grief and death that might be useful. *Anna:  A Daughter's Life. Trinna PostWilliam Loizeaux, [employer], [phone]. A father's account of his grief at the loss of his infant daughter. *Explaining Death to Children. Freddi CheEarl Grollman, [address] Homer AveBeacon Press, [id number]. A valuable guide to help adults/parents explain death to children. *When Pregnancy Fails: Families Coping with Miscarriage, Ectopic Pregnancy, Stillbirth, and Infant Death. Jodie EchevariaSusan Borg and Marijean HeathJudith Lasker, [employer], [id number]. A good resource to understand pregnancy loss, including some information on support for the family. *Hour of Gold, Hour of Lead: Diaries and Letters of Gaspar Colanne Morrow Lindbergh. [address]Harcourt Brace ClevelandJovanovich, [id number]. Cheri Fowlernne Lindbergh writes of the pain and loss in the wake of their young child's abduction. *A Child Dies: A Portrait of Family Grief. Clydene LamingJoan Hagan Arnold and Penelope [address] KenyonGemma, the [employer], [id number]. Dealing with death of unborn children, infants, and children of all ages. Revised by Davonna BellingNancy J. Lennox PippinsMcClellan, CNM, MS November, 2008  COPING WITH A MISCARRIAGE Newman NipSusan Donnis, R.N., M.A.T. You have had a miscarriage. You have lost a pregnancy. This is undoubtedly a difficult and sad time for you. Your loss may be hard to believe and it is frustrating to know you are helpless to change anything. "This can't possibly be happening to me. Why me? Why not someone else" I promise I'll be more careful if I can only have my baby back." These are common reactions to experiencing a miscarriage. Feelings of disbelief and anger, helplessness and guilt, depression and perhaps failure, are real and understandable. You may tell yourself that the guilt you feel is irrational, unreasonable or inappropriate, but it is there. "What did I do to cause this? Why wasn't I more careful? If only I had known. Is this a punishment for something I did?" You may find yourself recounting the days or weeks before your miscarriage, searching for clues that you feel sure you must have missed; searching for  valid reasons for how or why this happened. Having something "taken away" that you cherish feels shocking and unbelievable. You ask your doctor or midwife for an explanation; friends volunteer their opinions. In many cases, your questions of "how" or "why" are not satisfactorily answered. You may have some of the above thoughts or feelings and they may be present in different combinations, differing degrees, and in some confusion. They are understandable and part of the process of beginning to cope with a significant loss. With any  death, it is healthy and essential to allow yourself to experience grief whether you are a man or woman. The grieving process is not something begun and completed in a day, a week or a month. It takes time, understanding, support and acceptance. In the case of a miscarriage, this process is often more difficult because the death is not publicly acknowledged. There is no funeral to arrange and attend, no outward recognition of a loss. This may tend to increase feelings of isolation and loneliness. The term miscarriage is used here instead of the more medically correct term spontaneous abortion to avoid confusion with elective abortion. Some couples feel they must keep their grief invisible and "get over this quickly." Most employers will freely give time off to attend a family funeral, but many do not understand the reasonable and justified request for time off after a miscarriage. Loss of a longed-for pregnancy, regardless of how early, is a significant bereavement. Men and women may react to the miscarriage differently. Men may feel more helpless and frustrated than their wives because they often assume a passive-observer role. Waiting and watching can be harder than actively participating, regardless of the outcome. Men do experience a miscarriage in spite of not actively participating in the physical loss. People have different ways of coping with a loss and you  need to choose what is most helpful to you. Too often "being strong" is looked upon as a healthy way of coping, when, in fact, repressing and ignoring very real feelings is not helpful and contributes to serious coping problems. Some feelings and questions that men and women express and ask are:  It hangs over me like a black cloud. Though I've tried, I honestly don't know how to work this through. How do I say goodbye?  I feel extremely guilty. I guess I really didn't want this pregnancy. I feel relieved that it's over, but so guilty because I'm relieved. This isn't right.  I'm fine. Life must go on and I'm a strong person. There's really nothing to grieve for anyway, is there?  I feel pressure not to be sad. So I tend to cover up a lot.  How do I respond to well meaning but frustrating platitudes like, "Try again soon, dear. Another pregnancy will help." I would like to be pregnant again, but I believe a pregnancy should happen at a time of strength, not sadness.  How do I respond to silence and awkward attempts to avoid the whole issue? (You may be the one that decides to bring up the topic. Friends, relatives and acquaintances often do not know how to respond, so they say nothing).  I'm fine until my period comes. It is such a start reminder of the part of me that is lost.  My husband/wife and I always enjoyed a good sex life. Since my miscarriage, I have a hard time allowing myself to feel loving and sensuous. After all if such a loving and pleasurable experience as intercourse started something that ended in such a tragedy, it's too scary to think it might happen again. Why doesn't my husband/wife understand?  I have to face reality. Should I just force myself to attend my friend's baby shower?  Everyone was so supportive and caring when I was in the hospital. That was four months ago. I can't burden others with my sadness now. Why am I still sad? I should be over this thing by  now. Knowing common facts about miscarriage probably will not blot  out your negative feelings - denial, anger, disappointment, sadness - but facts can hold some comfort and can help cushion the sadness and guilt. For those wanting a child, it is frustrating and intensely disappointing to experience any miscarriage. However, most pregnancies that end in the first three months are imperfect in some way and, regardless of any precaution or intervention, are incapable of surviving and growing beyond approximately 14 weeks. This fact may not ease your sense of emptiness and sadness, but it may help ease your guilt and sense of assumed responsibility. Knowing clearly that you did not cause this to happen, that you are not directly responsible of your miscarriage by what you did or did not do, can bring a sense of relief and relaxation to your anxieties or guilt. A pregnancy that ends in later months is uniquely difficult also. Your protruding abdomen, your child's movements and heartbeat, are concrete proof of your expectant parenthood. A miscarriage occurring at 6 weeks or at 6 months causes a sudden change of events and feelings. Understandably, you may feel robbed or cheated of a promise of what was to be. It's easy to think that everyone else can have children. Surely I've been tricked or badly treated, you think. A fact not commonly know is that at least one in five pregnancies end in miscarriage, which indicates you are not alone. In a room of 25 couples, the odds are that 4 have experienced a miscarriage. Of these 8 people, many have shared your experience and feelings. Of course, this is not an easy topic to talk about, therefore it can appear that miscarriages rarely happen and that you are the only one coping with this loss. Realizing you are note alone can perhaps ease some of the shock and disbelief of "WHY ME?" Three actions will help: 1. Giver yourself permission to be sad. It is  possible to be sad without becoming chronically depressed. Set limits for yourself if you are concerned about becoming overly depressed. If the sadness begins to wash over you while at work at 2 p.m., tell yourself that you cannot deal with it then, but make an agreement with yourself that at 8 p.m. that night you will. And then do it. Many individuals report a beginning sense of control over their grief when they realize they can create appropriate and private times to be sad, alone, or with a significant other person. If you think you may be severely depressed (i.e. having difficulty getting out of bed in the morning, withdrawal from friends and relatives, insomnia, extended loss of appetite or overeating, loss of the ability to enjoy anything), this problem is probably not something you can handle yourself. It is important to seek out professional help at this time, or at any time when you are feeling particularly discouraged about your coping. 2. Find a supportive, trusted person and talk about your feelings and your questions. Build a support system, whether it be your spouse, partner, friend, relative, colleague, clergyman, support group or Pharmacist, hospital. You may still feel some pangs of sadness when attending a baby shower, celebrating a holiday, seeing other pregnant couples or passing the date of your expected delivery, but once you work through your loss, coping with your situation will become easier. 3. Also part of the process of life itself, is understanding and accepting incongruent or seemingly contradictory feelings. As one insightful women expressed, "Being happy for a friend who has just had a baby (or twins!) is a genuine feeling, but  also resentment, anger and frustration are there, all wrapped into one package;" two apparently conflicting feelings, but both can exist, be recognized, and accepted. A miscarriage is an unexpected, often unexplained, death; with any  loss you need to give yourself permission to grieve, to be angry, sad or relieved. There is no one appropriate reaction. Acknowledging your feelings that are real and understandable, though they may not be logical or rational, is part of the grieving process. Allowing the grieving process to happen is a positive way of coping and leads to health resolve and strength to look ahead. There may be times when you may not be able to put words to your feelings, but that does not need to stop you from seeking out and talking with an understanding person. Building your own support system will help you regain confidence and strength within yourself. RECOMMENDED READING The following materials are all available through [employer], [address], [place], [place] [id number]; phone [phone]. For Adults: Empty Arms by Peri MarisSherokee Ilse Empty Cradle, Broken Heart by Doyne Keeleborah Davis Ended Beginnings by C. Panuthos & C. Romeo Miscarriage a [employer] Resource Miscarriage - A Shattered Dream by Peri MarisSherokee Ilse Newborn Death a Probation officerCent[employer]. Resource Still To Be Born by Southwest AirlinesPerinatal Loss When a Doctor, hospitalBaby Dies by General DynamicsM.J. Church et al (TCF) When ARAMARK CorporationHello Means Goodbye by P. Schwiebert and Mort SawyersP. Kirk When Pregnancy Fails by Kathie RhodesS. Borg & Edwyna ReadyJ. Lasker For Helping Other Children How Do We Tell the Children? by Shaefer & Nunzio CoryLyons No New Baby by Val EagleMarilyn Gryte Talking About Death by Freddi CheEarl Grollman Where's Jess?? A [employer] Resource Other helpful materials available locally at [employer] Corporationbookstores: The Fall of Freddy the Leaf by Reggie PileLeo Buscaglia, PhD, [employer] (for children) When Filbert BertholdGoodbye is Forever - Learning to Live Again After the Loss of a Child by Carlyon ProwsJohn Bramblett, [employer]

## 2018-07-21 ENCOUNTER — Telehealth: Payer: Self-pay

## 2018-07-21 ENCOUNTER — Other Ambulatory Visit: Payer: Self-pay | Admitting: Maternal Newborn

## 2018-07-21 DIAGNOSIS — O039 Complete or unspecified spontaneous abortion without complication: Secondary | ICD-10-CM

## 2018-07-21 NOTE — Telephone Encounter (Signed)
Pt's [facility] called after hour nurse stating pt was having pain and cramping after miscarriage.  Wanted to know what OTC meds to take.  Was adv Motrin/Advil and told to call back if worsens.  [phone]  Left detailed msg to call me and let me know what's going on.

## 2018-07-23 ENCOUNTER — Encounter: Payer: Self-pay | Admitting: Maternal Newborn

## 2018-07-23 ENCOUNTER — Ambulatory Visit (INDEPENDENT_AMBULATORY_CARE_PROVIDER_SITE_OTHER): Payer: BLUE CROSS/BLUE SHIELD | Admitting: Maternal Newborn

## 2018-07-23 ENCOUNTER — Ambulatory Visit (INDEPENDENT_AMBULATORY_CARE_PROVIDER_SITE_OTHER): Payer: BLUE CROSS/BLUE SHIELD

## 2018-07-23 ENCOUNTER — Other Ambulatory Visit: Payer: Self-pay | Admitting: Maternal Newborn

## 2018-07-23 VITALS — BP 102/62 | Ht 68.0 in | Wt 166.0 lb

## 2018-07-23 DIAGNOSIS — O039 Complete or unspecified spontaneous abortion without complication: Secondary | ICD-10-CM | POA: Diagnosis not present

## 2018-07-23 NOTE — Progress Notes (Signed)
Follow up visit for miscarriage  S: Having some occasional cramping. She passed tissue/placenta and had heavier bleeding Friday and Saturday. Bleeding is now like a period. No fever or other signs of infection. Appropriately distressed about miscarriage.  O: Blood pressure 102/62, height 5\' 8"  (1.727 m), weight 166 lb (75.3 kg), last menstrual period 04/10/2018.  Physical Exam Constitutional:      Appearance: Normal appearance.  HENT:     Head: Normocephalic.  Pulmonary:     Effort: Pulmonary effort is normal.  Neurological:     Mental Status: She is oriented to person, place, and time.  Psychiatric:        Mood and Affect: Mood normal.        Behavior: Behavior normal.        Thought Content: Thought content normal.   Ultrasound today showed no intrauterine pregnancy, endometrium at 10.4 mm.   A: 38 year old with a miscarriage, normal ultrasound for this stage in the process.  P: Condolences expressed.  Return to care with any signs or symptoms of infection. Discussed normal course of miscarriage and return of menses, conception following miscarriage. She will call with any questions or concerns.  Marcelyn Bruins, CNM 07/23/2018

## 2018-07-28 NOTE — Telephone Encounter (Signed)
Pt was seen 05/14/2019

## 2018-07-30 ENCOUNTER — Encounter: Payer: Self-pay | Admitting: Maternal Newborn

## 2018-08-01 ENCOUNTER — Encounter: Payer: BLUE CROSS/BLUE SHIELD | Admitting: Obstetrics and Gynecology

## 2019-04-04 ENCOUNTER — Encounter (HOSPITAL_COMMUNITY): Payer: Self-pay

## 2019-04-10 DIAGNOSIS — R600 Localized edema: Secondary | ICD-10-CM | POA: Diagnosis not present

## 2019-07-15 ENCOUNTER — Encounter: Payer: BLUE CROSS/BLUE SHIELD | Admitting: Obstetrics & Gynecology

## 2019-07-16 DIAGNOSIS — O039 Complete or unspecified spontaneous abortion without complication: Secondary | ICD-10-CM | POA: Diagnosis not present

## 2019-08-25 IMAGING — US US OB COMP LESS 14 WK
1 series · 14 of 28 positions shown · non-contrast
Comparison: Obstetric ultrasound 07/04/2018

CLINICAL DATA: Pregnant patient in first-trimester pregnancy with
lower abdominal cramping and vaginal bleeding.

EXAM:
OBSTETRIC <14 WK US AND TRANSVAGINAL OB US
TECHNIQUE: Both transabdominal and transvaginal ultrasound examinations were
performed for complete evaluation of the gestation as well as the
maternal uterus, adnexal regions, and pelvic cul-de-sac.
Transvaginal technique was performed to assess early pregnancy.

[Series 1: us ob comp less 14 wk · 0.25mm/px · 14 of 57 slices shown]
[im 3/57]
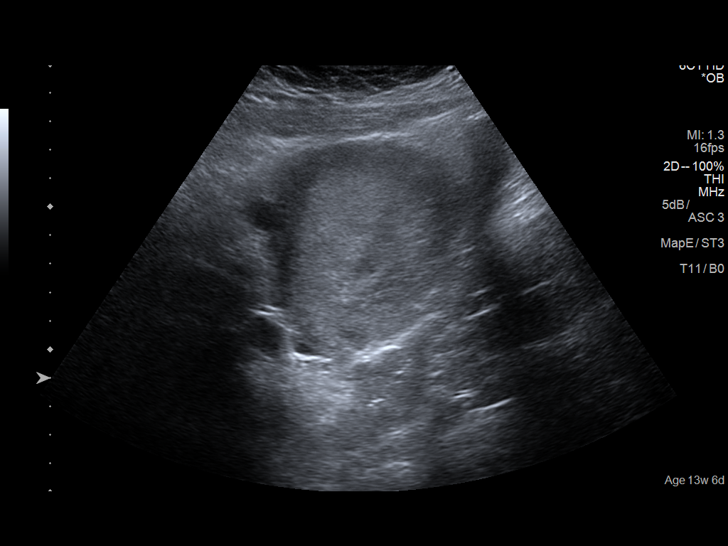
[im 7/57]
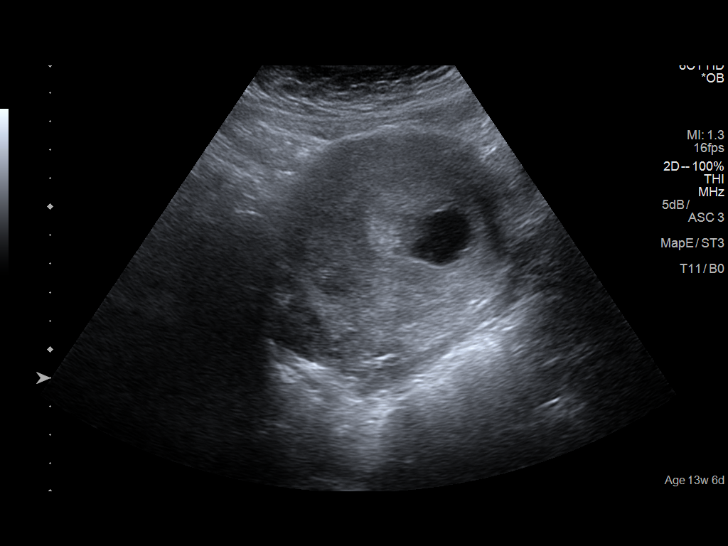
[im 11/57]
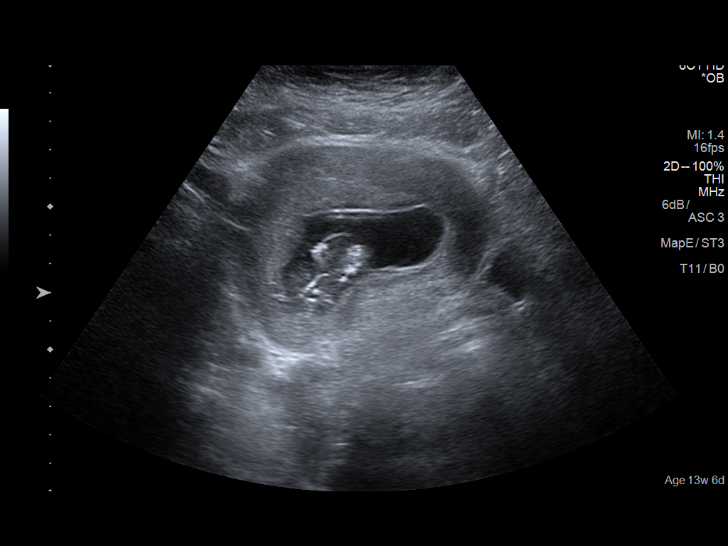
[im 15/57]
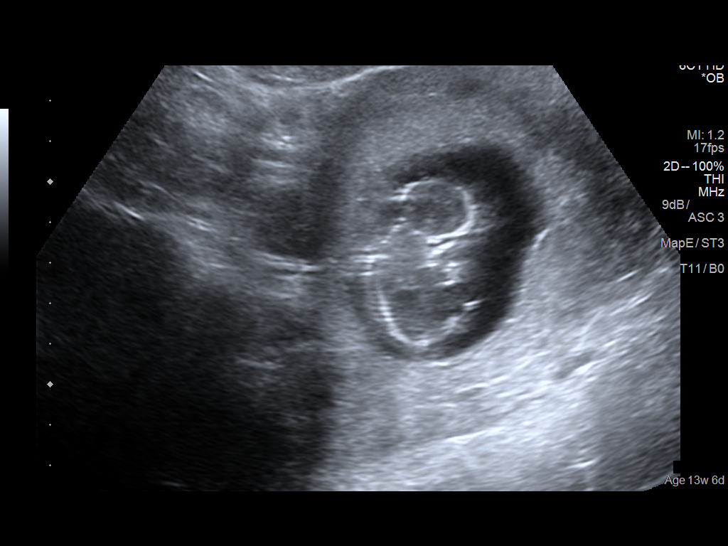
[im 19/57]
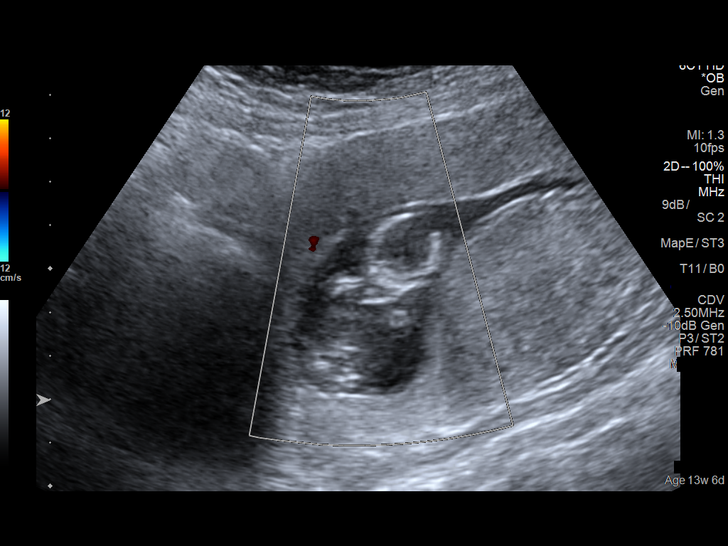
[im 23/57]
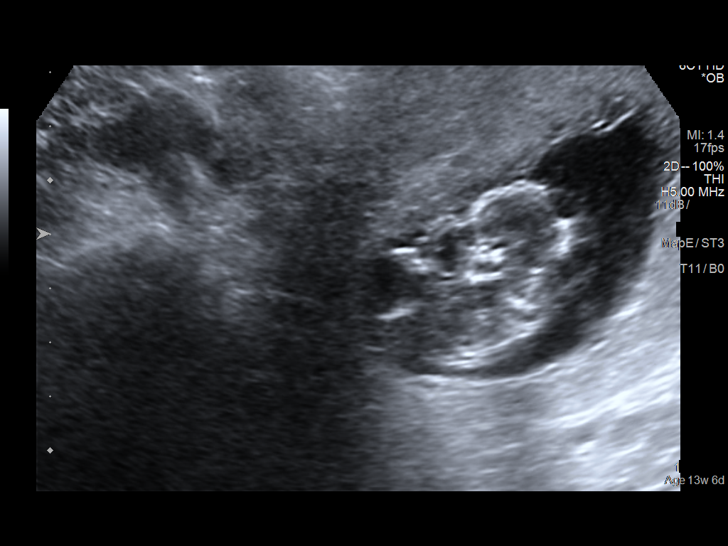
[im 27/57]
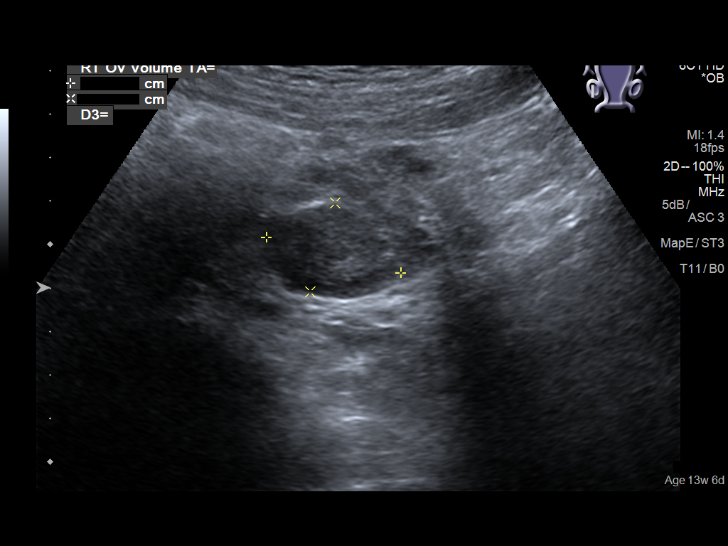
[im 32/57]
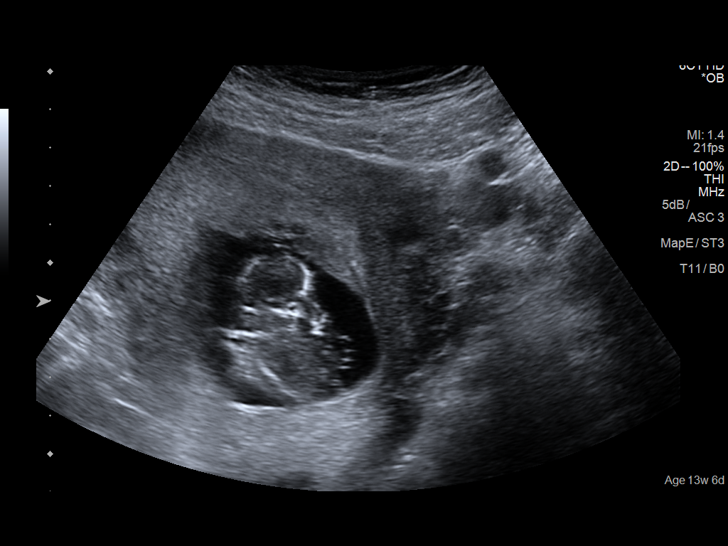
[im 36/57]
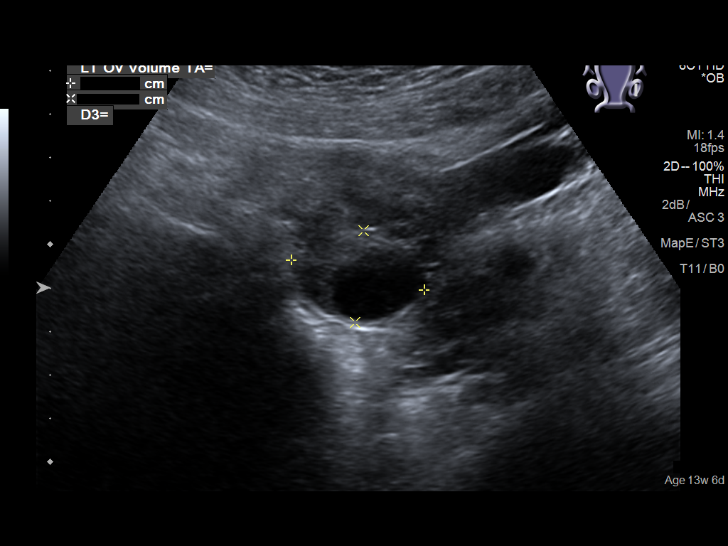
[im 40/57]
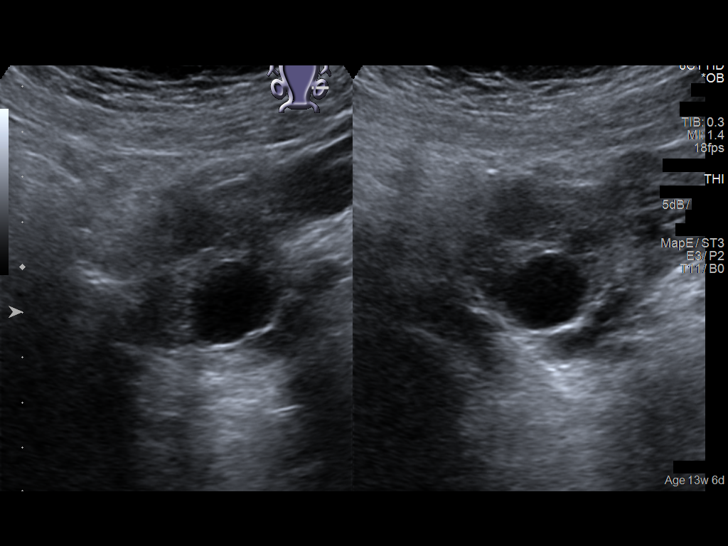
[im 44/57]
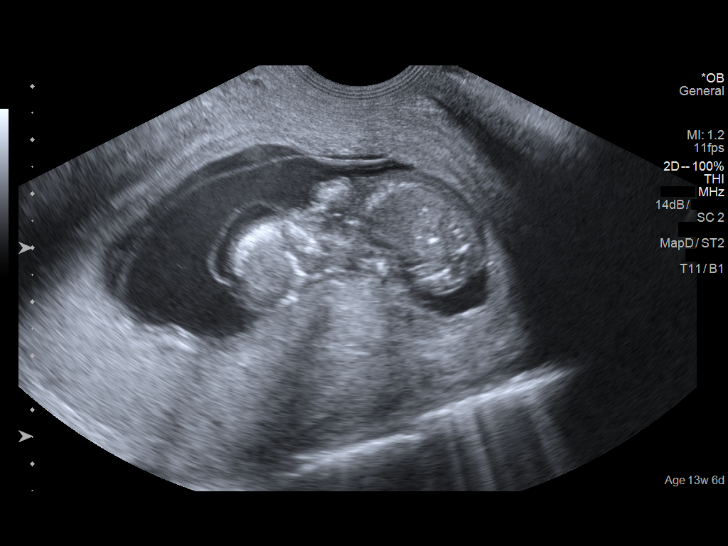
[im 48/57]
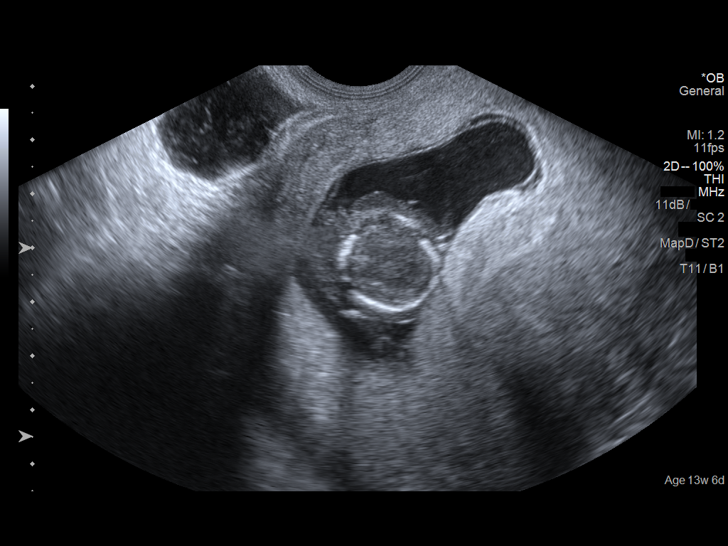
[im 52/57]
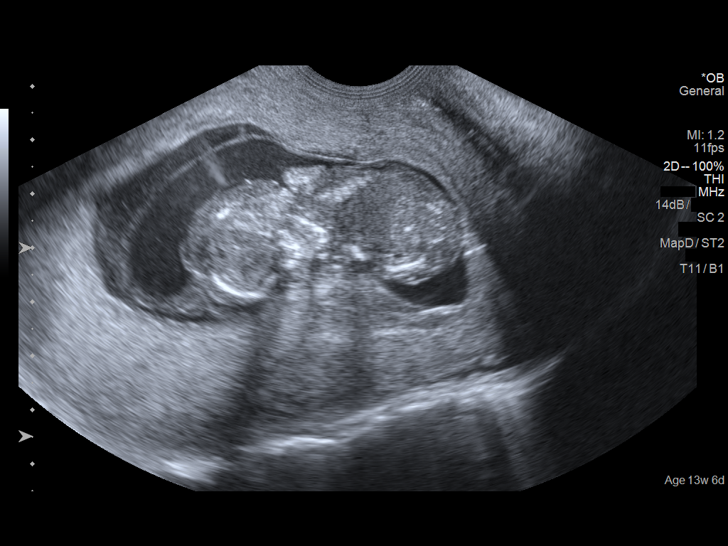
[im 57/57]
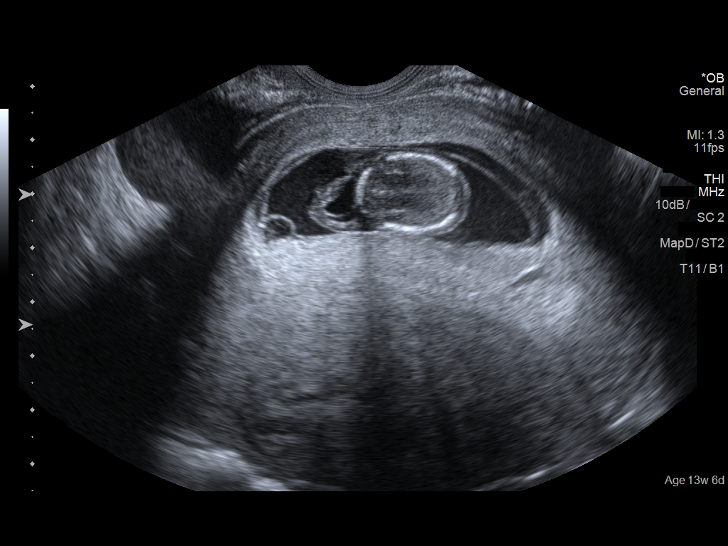

[14 of 28 positions shown; findings below may reference images not displayed]

FINDINGS: Intrauterine gestational sac: Single, decreased in size from prior
exam

Yolk sac:  Not Visualized.

Embryo:  Visualized.

Cardiac Activity: Not Visualized.

Heart Rate: 0 bpm

CRL:  50.1 mm   11 w   5 d

Increased nuchal translucency noted, as suggested con prior exam.

Subchorionic hemorrhage:  None visualized.

Maternal uterus/adnexae: The right ovary is normal. 2 cm cyst in the
left ovary. Normal ovarian blood flow. No adnexal mass.
IMPRESSION: Intrauterine pregnancy, currently measuring 11 weeks 5 days, with
fetal demise. No fetal cardiac activity.

## 2020-05-03 ENCOUNTER — Encounter: Payer: Self-pay | Admitting: *Deleted

## 2020-05-03 ENCOUNTER — Ambulatory Visit
Admission: EM | Admit: 2020-05-03 | Discharge: 2020-05-03 | Disposition: A | Discharge status: Home / Self Care | Payer: BC Managed Care – PPO | Attending: Physician Assistant | Admitting: Physician Assistant

## 2020-05-03 ENCOUNTER — Other Ambulatory Visit: Payer: Self-pay

## 2020-05-03 DIAGNOSIS — N23 Unspecified renal colic: Secondary | ICD-10-CM

## 2020-05-03 DIAGNOSIS — R109 Unspecified abdominal pain: Secondary | ICD-10-CM

## 2020-05-03 LAB — POCT URINALYSIS DIP (MANUAL ENTRY)
Bilirubin, UA: NEGATIVE
Glucose, UA: NEGATIVE mg/dL
Ketones, POC UA: NEGATIVE mg/dL
Leukocytes, UA: NEGATIVE
Nitrite, UA: NEGATIVE
Protein Ur, POC: 30 mg/dL — AB
Spec Grav, UA: >=1.03 — AB (ref 1.010–1.025)
Urobilinogen, UA: 0.2 E.U./dL
pH, UA: 6 (ref 5.0–8.0)

## 2020-05-03 LAB — POCT URINE PREGNANCY: Preg Test, Ur: NEGATIVE

## 2020-05-03 MED ORDER — KETOROLAC TROMETHAMINE 15 MG/ML IJ SOLN
15.0000 mg | Freq: Once | INTRAMUSCULAR | Status: AC
  Administered 2020-05-03: 15 mg via INTRAMUSCULAR

## 2020-05-03 MED ORDER — TAMSULOSIN HCL 0.4 MG PO CAPS: 0.4000 mg | ORAL_CAPSULE | Freq: Every day | ORAL | 0 refills | Status: DC

## 2020-05-03 MED ORDER — HYDROCODONE-ACETAMINOPHEN 5-325 MG PO TABS: 1.0000 | ORAL_TABLET | Freq: Four times a day (QID) | ORAL | 0 refills | Status: DC | PRN

## 2020-05-03 MED ORDER — KETOROLAC TROMETHAMINE 10 MG PO TABS: 10.0000 mg | ORAL_TABLET | Freq: Four times a day (QID) | ORAL | 0 refills | Status: DC | PRN

## 2020-05-03 NOTE — Discharge Instructions (Signed)
As discussed, history and exam consistent with kidney stone. Toradol injection in office today. Toradol, norco for pain. Flomax as directed. Keep hydrated, urine should be clear to pale yellow in color. If pain not well controlled, unable to urinate, vomiting, fever, go to the emergency department for further evaluation.

## 2020-05-03 NOTE — ED Triage Notes (Signed)
Patient in with complaints of lower left sided abdominal pain that radiates to the back. Patient states that pain started around 10 am today. Patient states she experienced similar pain on 04/15/20. Patient denies any changes in urine and denies nausea. No medications taken at home.

## 2020-05-03 NOTE — ED Provider Notes (Signed)
[facility]    CSN: [id number] Arrival date & time: 05/03/20  1207      History   Chief Complaint Chief Complaint  Patient presents with  . Abdominal Pain    HPI Karla Moody is a 39 y.o. female.   39 year old female comes in for acute onset of left back pain that radiates to the LLQ. Denies injury/trauma. Can have nausea when pain is severe. No vomiting. Denies diarrhea/constipatoin. Denies fever. Denies urinary changes. States pain at times can radiate to the left vaginal area. Otherwise no vaginal discharge, itching. LMP 04/22/2020     Past Medical History:  Diagnosis Date  . Advanced maternal age in multigravida     There are no problems to display for this patient.   Past Surgical History:  Procedure Laterality Date  . WISDOM TOOTH EXTRACTION  2017   All four;      OB History    Gravida  3   Para  2   Term  2   Preterm      AB      Living  2     SAB      TAB      Ectopic      Multiple      Live Births  2            Home Medications    Prior to Admission medications   Medication Sig Start Date End Date Taking? Authorizing Provider  HYDROcodone-acetaminophen (NORCO/VICODIN) 5-325 MG tablet Take 1 tablet by mouth every 6 (six) hours as needed for severe pain. 05/03/20   Cathie Hoops, Davien Malone V, PA-C  ketorolac (TORADOL) 10 MG tablet Take 1 tablet (10 mg total) by mouth every 6 (six) hours as needed. 05/03/20   Cathie Hoops, Leonore Frankson V, PA-C  Nutritional Supplements (JUICE PLUS FIBRE PO) Take by mouth.    [provider]  tamsulosin (FLOMAX) 0.4 MG CAPS capsule Take 1 capsule (0.4 mg total) by mouth daily. 05/03/20   Belinda Fisher, PA-C    Family History Family History  Problem Relation Age of Onset  . Diabetes Father   . Heart disease Father        sudden death  . Heart disease Paternal Grandfather   . Multiple sclerosis Paternal Aunt     Social History Social History   Tobacco Use  . Smoking status: Never Smoker  . Smokeless  tobacco: Never Used  Vaping Use  . Vaping Use: Never used  Substance Use Topics  . Alcohol use: Not Currently    Comment: occ wine  . Drug use: Never     Allergies   Patient has no known allergies.   Review of Systems Review of Systems  Reason unable to perform ROS: See HPI as above.     Physical Exam Triage Vital Signs ED Triage Vitals  Enc Vitals Group     BP 05/03/20 1219 132/83     Pulse Rate 05/03/20 1219 74     Resp 05/03/20 1219 20     Temp 05/03/20 1219 98.1 F (36.7 C)     Temp Source 05/03/20 1219 Oral     SpO2 05/03/20 1219 100 %     Weight --      Height --      Head Circumference --      Peak Flow --      Pain Score 05/03/20 1220 8     Pain Loc --  Pain Edu? --      Excl. in GC? --    No data found.  Updated Vital Signs BP 132/83 (BP Location: Left Arm)   Pulse 74   Temp 98.1 F (36.7 C) (Oral)   Resp 20   LMP 04/22/2020   SpO2 100%   Physical Exam Constitutional:      General: She is not in acute distress.    Appearance: She is well-developed. She is not ill-appearing, toxic-appearing or diaphoretic.  HENT:     Head: Normocephalic and atraumatic.  Eyes:     Conjunctiva/sclera: Conjunctivae normal.     Pupils: Pupils are equal, round, and reactive to light.  Cardiovascular:     Rate and Rhythm: Normal rate and regular rhythm.  Pulmonary:     Effort: Pulmonary effort is normal. No respiratory distress.     Comments: LCTAB Abdominal:     General: Bowel sounds are normal.     Palpations: Abdomen is soft.     Tenderness: There is no abdominal tenderness. There is left CVA tenderness. There is no right CVA tenderness, guarding or rebound.  Musculoskeletal:     Cervical back: Normal range of motion and neck supple.  Skin:    General: Skin is warm and dry.  Neurological:     Mental Status: She is alert and oriented to person, place, and time.  Psychiatric:        Behavior: Behavior normal.        Judgment: Judgment normal.       UC Treatments / Results  Labs (all labs ordered are listed, but only abnormal results are displayed) Labs Reviewed  POCT URINALYSIS DIP (MANUAL ENTRY) - Abnormal; Notable for the following components:      Result Value   Spec Grav, UA >=1.030 (*)    Blood, UA moderate (*)    Protein Ur, POC =30 (*)    All other components within normal limits  POCT URINE PREGNANCY    EKG   Radiology No results found.  Procedures Procedures (including critical care time)  Medications Ordered in UC Medications  ketorolac (TORADOL) 15 MG/ML injection 15 mg (15 mg Intramuscular Given 05/03/20 1251)    Initial Impression / Assessment and Plan / UC Course  I have reviewed the triage vital signs and the nursing notes.  Pertinent labs & imaging results that were available during my care of the patient were reviewed by me and considered in my medical decision making (see chart for details).    History and exam consistent with renal colic, urethral stone. toradol injection in office today.  Slight improvement of pain after toradol. Flomax as directed. Toradol, norco as needed. Push fluids. Return precautions given. Patient expresses understanding and agrees to plan.  Final Clinical Impressions(s) / UC Diagnoses   Final diagnoses:  Renal colic on left side  Left flank pain   ED Prescriptions    Medication Sig Dispense Auth. Provider   ketorolac (TORADOL) 10 MG tablet Take 1 tablet (10 mg total) by mouth every 6 (six) hours as needed. 20 tablet Caroline Matters V, PA-C   tamsulosin (FLOMAX) 0.4 MG CAPS capsule Take 1 capsule (0.4 mg total) by mouth daily. 7 capsule Colen Eltzroth V, PA-C   HYDROcodone-acetaminophen (NORCO/VICODIN) 5-325 MG tablet Take 1 tablet by mouth every 6 (six) hours as needed for severe pain. 10 tablet Belinda Fisher, PA-C     I have reviewed the PDMP during this encounter.   Belinda Fisher, PA-C 05/03/20  1328  

## 2020-05-03 NOTE — ED Notes (Signed)
Patient states she had one episode of emesis while collecting urine sample.

## 2020-05-04 DIAGNOSIS — R102 Pelvic and perineal pain: Secondary | ICD-10-CM | POA: Diagnosis not present

## 2020-05-04 DIAGNOSIS — R319 Hematuria, unspecified: Secondary | ICD-10-CM | POA: Diagnosis not present

## 2020-05-05 ENCOUNTER — Emergency Department: Payer: BC Managed Care – PPO

## 2020-05-05 ENCOUNTER — Emergency Department
Admission: EM | Admit: 2020-05-05 | Discharge: 2020-05-05 | Disposition: A | Discharge status: Home / Self Care | Payer: BC Managed Care – PPO | Attending: Emergency Medicine | Admitting: Emergency Medicine

## 2020-05-05 ENCOUNTER — Encounter: Payer: Self-pay | Admitting: Intensive Care

## 2020-05-05 ENCOUNTER — Other Ambulatory Visit: Payer: Self-pay

## 2020-05-05 DIAGNOSIS — N210 Calculus in bladder: Secondary | ICD-10-CM | POA: Diagnosis not present

## 2020-05-05 DIAGNOSIS — N201 Calculus of ureter: Secondary | ICD-10-CM | POA: Diagnosis not present

## 2020-05-05 DIAGNOSIS — N132 Hydronephrosis with renal and ureteral calculous obstruction: Secondary | ICD-10-CM | POA: Insufficient documentation

## 2020-05-05 DIAGNOSIS — R102 Pelvic and perineal pain: Secondary | ICD-10-CM | POA: Diagnosis not present

## 2020-05-05 DIAGNOSIS — N23 Unspecified renal colic: Secondary | ICD-10-CM | POA: Diagnosis not present

## 2020-05-05 LAB — BASIC METABOLIC PANEL
Anion gap: 7 (ref 5–15)
BUN: 17 mg/dL (ref 6–20)
CO2: 27 mmol/L (ref 22–32)
Calcium: 9.2 mg/dL (ref 8.9–10.3)
Chloride: 106 mmol/L (ref 98–111)
Creatinine, Ser: 1.14 mg/dL — ABNORMAL HIGH (ref 0.44–1.00)
GFR, Estimated: >60 mL/min (ref >60)
Glucose, Bld: 112 mg/dL — ABNORMAL HIGH (ref 70–99)
Potassium: 4.1 mmol/L (ref 3.5–5.1)
Sodium: 140 mmol/L (ref 135–145)

## 2020-05-05 LAB — URINALYSIS, COMPLETE (UACMP) WITH MICROSCOPIC
Bilirubin Urine: NEGATIVE
Glucose, UA: NEGATIVE mg/dL
Hgb urine dipstick: NEGATIVE
Ketones, ur: NEGATIVE mg/dL
Leukocytes,Ua: NEGATIVE
Nitrite: NEGATIVE
Protein, ur: NEGATIVE mg/dL
Specific Gravity, Urine: 1.013 (ref 1.005–1.030)
pH: 7 (ref 5.0–8.0)

## 2020-05-05 LAB — CBC
HCT: 41.3 % (ref 36.0–46.0)
Hemoglobin: 13.7 g/dL (ref 12.0–15.0)
MCH: 29 pg (ref 26.0–34.0)
MCHC: 33.2 g/dL (ref 30.0–36.0)
MCV: 87.5 fL (ref 80.0–100.0)
Platelets: 216 10*3/uL (ref 150–400)
RBC: 4.72 MIL/uL (ref 3.87–5.11)
RDW: 12.8 % (ref 11.5–15.5)
WBC: 8.8 10*3/uL (ref 4.0–10.5)
nRBC: 0 % (ref 0.0–0.2)

## 2020-05-05 LAB — POC URINE PREG, ED: Preg Test, Ur: NEGATIVE

## 2020-05-05 NOTE — ED Triage Notes (Signed)
Patient c/o flank pain Since Sunday. Reports having recent pelvic US with unremarkable results. Reports pain starts in front left and radiates to back. Reports some pain near end of urinating

## 2020-05-05 NOTE — ED Notes (Signed)
Pt states some pelvic pain and pain after urination

## 2020-05-05 NOTE — ED Provider Notes (Signed)
[facility] Emergency Department Provider Note ____________________________________________   First MD Initiated Contact with Patient 05/05/20 1616     (approximate)  I have reviewed the triage vital signs and the nursing notes.   HISTORY  Chief Complaint Flank Pain    HPI Karla Moody is a 39 y.o. female with no active medical problems who presents with left pelvic pain over the last several days, intermittent course, radiating towards her lower back and flank, and sometimes associated with dysuria.  The patient denies any associated vaginal bleeding.  She has had nausea but no vomiting.  She denies any fever or chills.  She was initially seen at urgent care on 10/26 and diagnosed with a likely kidney stone.  She subsequently followed up with her OB/GYN (which was a previously scheduled appointment) yesterday and today, and was referred into the ED due to acute pain.  Past Medical History:  Diagnosis Date  . Advanced maternal age in multigravida     There are no problems to display for this patient.   Past Surgical History:  Procedure Laterality Date  . WISDOM TOOTH EXTRACTION  2017   All four;      Prior to Admission medications   Medication Sig Start Date End Date Taking? Authorizing Provider  HYDROcodone-acetaminophen (NORCO/VICODIN) 5-325 MG tablet Take 1 tablet by mouth every 6 (six) hours as needed for severe pain. 05/03/20   Cathie Hoops, Amy V, PA-C  ketorolac (TORADOL) 10 MG tablet Take 1 tablet (10 mg total) by mouth every 6 (six) hours as needed. 05/03/20   Cathie Hoops, Amy V, PA-C  Nutritional Supplements (JUICE PLUS FIBRE PO) Take by mouth.    [provider]  tamsulosin (FLOMAX) 0.4 MG CAPS capsule Take 1 capsule (0.4 mg total) by mouth daily. 05/03/20   Belinda Fisher, PA-C    Allergies Patient has no known allergies.  Family History  Problem Relation Age of Onset  . Diabetes Father   . Heart disease Father        sudden death  . Heart  disease Paternal Grandfather   . Multiple sclerosis Paternal Aunt     Social History Social History   Tobacco Use  . Smoking status: Never Smoker  . Smokeless tobacco: Never Used  Vaping Use  . Vaping Use: Never used  Substance Use Topics  . Alcohol use: Yes    Comment: occ wine  . Drug use: Never    Review of Systems  Constitutional: No fever/chills. Eyes: No visual changes. ENT: No sore throat. Cardiovascular: Denies chest pain. Respiratory: Denies shortness of breath. Gastrointestinal: No vomiting or diarrhea.  Genitourinary: Positive for dysuria.  Musculoskeletal: Positive for back pain. Skin: Negative for rash. Neurological: Negative for headache.   ____________________________________________   PHYSICAL EXAM:  VITAL SIGNS: ED Triage Vitals  Enc Vitals Group     BP 05/05/20 1522 125/87     Pulse Rate 05/05/20 1522 73     Resp 05/05/20 1522 18     Temp 05/05/20 1522 98.5 F (36.9 C)     Temp Source 05/05/20 1522 Oral     SpO2 05/05/20 1522 100 %     Weight 05/05/20 1523 185 lb (83.9 kg)     Height 05/05/20 1523 5\' 8"  (1.727 m)     Head Circumference --      Peak Flow --      Pain Score 05/05/20 1523 2     Pain Loc --      Pain  Edu? --      Excl. in GC? --     Constitutional: Alert and oriented. Well appearing and in no acute distress. Eyes: Conjunctivae are normal.  Head: Atraumatic. Nose: No congestion/rhinnorhea. Mouth/Throat: Mucous membranes are moist.   Neck: Normal range of motion.  Cardiovascular: Normal rate, regular rhythm.  Good peripheral circulation. Respiratory: Normal respiratory effort.  No retractions.  Gastrointestinal: Soft and nontender. No distention.  Genitourinary: Mild left CVA tenderness. Musculoskeletal: Extremities warm and well perfused.  Neurologic:  Normal speech and language. No gross focal neurologic deficits are appreciated.  Skin:  Skin is warm and dry. No rash noted. Psychiatric: Mood and affect are normal.  Speech and behavior are normal.  ____________________________________________   LABS (all labs ordered are listed, but only abnormal results are displayed)  Labs Reviewed  URINALYSIS, COMPLETE (UACMP) WITH MICROSCOPIC - Abnormal; Notable for the following components:      Result Value   Color, Urine YELLOW (*)    APPearance CLEAR (*)    Bacteria, UA RARE (*)    All other components within normal limits  BASIC METABOLIC PANEL - Abnormal; Notable for the following components:   Glucose, Bld 112 (*)    Creatinine, Ser 1.14 (*)    All other components within normal limits  CBC  POC URINE PREG, ED   ____________________________________________  EKG   ____________________________________________  RADIOLOGY  CT abdomen/pelvis interpreted by me shows a ureteral stone approximately at the left UVJ.  ____________________________________________   PROCEDURES  Procedure(s) performed: No  Procedures  Critical Care performed: No ____________________________________________   INITIAL IMPRESSION / ASSESSMENT AND PLAN / ED COURSE  Pertinent labs & imaging results that were available during my care of the patient were reviewed by me and considered in my medical decision making (see chart for details).  39 year old female with no active medical problems presents with left pelvic/flank pain over the last several days.  I reviewed the past medical records in Epic and Care Everywhere.  The patient was seen at urgent care on 10/26 and diagnosed clinically with a ureteral stone, although no imaging was performed at that time.  Subsequently she followed up with her OB/GYN Dr. Feliberto Gottron and had a pelvic ultrasound (I am able to see the visit record, but not the ultrasound read specifically).  The patient states that the ultrasound was unremarkable.  On exam, the patient overall is well-appearing.  Her vital signs are normal.  She has mild left CVA tenderness and no focal abdominal  tenderness.  She reports that the pain is currently somewhat subsided although she had a fairly severe wave earlier this afternoon.  Overall presentation is most consistent with a ureteral stone.  Although the patient is young and the pain seems to be improving, I discussed with her about risks and benefits from imaging, and based on shared decision making she would like to obtain a CT for further evaluation.  I think this is reasonable especially given that she has no prior history of stones.  ----------------------------------------- 5:58 PM on 05/05/2020 -----------------------------------------  CT confirms a 4 mm stone at the left UVJ or just passed.  The patient has had no recurrence of her severe pain while in the ED.  I suspect that she has in fact passed the stone into the bladder.  There is also a likely phlebolith, and mild hydronephrosis.  At this time, the patient is stable for discharge home.  She already has pain medications and Flomax prescribed from urgent care, as  well as urology referral.  Return precautions given, and the patient expresses understanding.  ____________________________________________   FINAL CLINICAL IMPRESSION(S) / ED DIAGNOSES  Final diagnoses:  Ureteral stone      NEW MEDICATIONS STARTED DURING THIS VISIT:  New Prescriptions   No medications on file     Note:  This document was prepared using Dragon voice recognition software and may include unintentional dictation errors.    Dionne Bucy, MD 05/05/20 1759

## 2020-05-05 NOTE — Discharge Instructions (Addendum)
If needed, you may take the medications that were prescribed at urgent care.  Toradol is an NSAID (anti-inflammatory) and is not sedating.  You may follow-up with the urologist as referred from urgent care if needed.  Return to the ER for new, worsening, or persistent severe pain, nausea or vomiting, fever, weakness, or any other new or worsening symptoms that concern you.

## 2020-06-07 ENCOUNTER — Ambulatory Visit: Payer: Self-pay | Admitting: Urology

## 2020-09-20 DIAGNOSIS — F330 Major depressive disorder, recurrent, mild: Secondary | ICD-10-CM | POA: Diagnosis not present

## 2020-10-06 DIAGNOSIS — F330 Major depressive disorder, recurrent, mild: Secondary | ICD-10-CM | POA: Diagnosis not present

## 2020-10-20 DIAGNOSIS — F330 Major depressive disorder, recurrent, mild: Secondary | ICD-10-CM | POA: Diagnosis not present

## 2020-10-26 DIAGNOSIS — F330 Major depressive disorder, recurrent, mild: Secondary | ICD-10-CM | POA: Diagnosis not present

## 2020-11-08 DIAGNOSIS — F330 Major depressive disorder, recurrent, mild: Secondary | ICD-10-CM | POA: Diagnosis not present

## 2020-11-29 DIAGNOSIS — F330 Major depressive disorder, recurrent, mild: Secondary | ICD-10-CM | POA: Diagnosis not present

## 2020-12-13 DIAGNOSIS — F330 Major depressive disorder, recurrent, mild: Secondary | ICD-10-CM | POA: Diagnosis not present

## 2020-12-21 DIAGNOSIS — F330 Major depressive disorder, recurrent, mild: Secondary | ICD-10-CM | POA: Diagnosis not present

## 2021-01-13 DIAGNOSIS — F330 Major depressive disorder, recurrent, mild: Secondary | ICD-10-CM | POA: Diagnosis not present

## 2021-01-19 DIAGNOSIS — F330 Major depressive disorder, recurrent, mild: Secondary | ICD-10-CM | POA: Diagnosis not present

## 2021-01-26 DIAGNOSIS — F330 Major depressive disorder, recurrent, mild: Secondary | ICD-10-CM | POA: Diagnosis not present

## 2021-02-02 DIAGNOSIS — F330 Major depressive disorder, recurrent, mild: Secondary | ICD-10-CM | POA: Diagnosis not present

## 2021-02-09 DIAGNOSIS — F330 Major depressive disorder, recurrent, mild: Secondary | ICD-10-CM | POA: Diagnosis not present

## 2021-02-12 ENCOUNTER — Other Ambulatory Visit: Payer: Self-pay

## 2021-02-12 ENCOUNTER — Emergency Department: Payer: BC Managed Care – PPO

## 2021-02-12 ENCOUNTER — Emergency Department
Admission: EM | Admit: 2021-02-12 | Discharge: 2021-02-12 | Disposition: A | Discharge status: Home / Self Care | Payer: BC Managed Care – PPO | Attending: Emergency Medicine | Admitting: Emergency Medicine

## 2021-02-12 DIAGNOSIS — R29818 Other symptoms and signs involving the nervous system: Secondary | ICD-10-CM | POA: Diagnosis not present

## 2021-02-12 DIAGNOSIS — G43109 Migraine with aura, not intractable, without status migrainosus: Secondary | ICD-10-CM | POA: Diagnosis not present

## 2021-02-12 DIAGNOSIS — R519 Headache, unspecified: Secondary | ICD-10-CM | POA: Diagnosis not present

## 2021-02-12 LAB — CBC
HCT: 40.1 % (ref 36.0–46.0)
Hemoglobin: 13.6 g/dL (ref 12.0–15.0)
MCH: 29.7 pg (ref 26.0–34.0)
MCHC: 33.9 g/dL (ref 30.0–36.0)
MCV: 87.6 fL (ref 80.0–100.0)
Platelets: 251 10*3/uL (ref 150–400)
RBC: 4.58 MIL/uL (ref 3.87–5.11)
RDW: 13.2 % (ref 11.5–15.5)
WBC: 6.8 10*3/uL (ref 4.0–10.5)
nRBC: 0 % (ref 0.0–0.2)

## 2021-02-12 LAB — POC URINE PREG, ED: Preg Test, Ur: NEGATIVE

## 2021-02-12 LAB — COMPREHENSIVE METABOLIC PANEL
ALT: 23 U/L (ref 0–44)
AST: 20 U/L (ref 15–41)
Albumin: 3.8 g/dL (ref 3.5–5.0)
Alkaline Phosphatase: 64 U/L (ref 38–126)
Anion gap: 7 (ref 5–15)
BUN: 16 mg/dL (ref 6–20)
CO2: 26 mmol/L (ref 22–32)
Calcium: 9.2 mg/dL (ref 8.9–10.3)
Chloride: 106 mmol/L (ref 98–111)
Creatinine, Ser: 0.78 mg/dL (ref 0.44–1.00)
GFR, Estimated: >60 mL/min (ref >60)
Glucose, Bld: 88 mg/dL (ref 70–99)
Potassium: 4.3 mmol/L (ref 3.5–5.1)
Sodium: 139 mmol/L (ref 135–145)
Total Bilirubin: 0.9 mg/dL (ref 0.3–1.2)
Total Protein: 6.9 g/dL (ref 6.5–8.1)

## 2021-02-12 MED ORDER — SODIUM CHLORIDE 0.9 % IV BOLUS
1000.0000 mL | Freq: Once | INTRAVENOUS | Status: AC
  Administered 2021-02-12: 1000 mL via INTRAVENOUS

## 2021-02-12 MED ORDER — KETOROLAC TROMETHAMINE 30 MG/ML IJ SOLN
30.0000 mg | Freq: Once | INTRAMUSCULAR | Status: AC
  Administered 2021-02-12: 30 mg via INTRAVENOUS
  Filled 2021-02-12: qty 1

## 2021-02-12 MED ORDER — ASPIRIN 81 MG PO CHEW
324.0000 mg | CHEWABLE_TABLET | Freq: Once | ORAL | Status: AC
  Administered 2021-02-12: 324 mg via ORAL
  Filled 2021-02-12: qty 4

## 2021-02-12 MED ORDER — MAGNESIUM SULFATE 2 GM/50ML IV SOLN
2.0000 g | Freq: Once | INTRAVENOUS | Status: AC
  Administered 2021-02-12: 2 g via INTRAVENOUS
  Filled 2021-02-12: qty 50

## 2021-02-12 NOTE — ED Notes (Signed)
See triage note  Presents with h/a  States she did have some left sided facial numbness and numbness into hand  States that is better but cont's with h/a

## 2021-02-12 NOTE — ED Notes (Signed)
Denies hx of migraines except in high school. Per Dr. Darnelle Catalan, basic labs and CT scan.

## 2021-02-12 NOTE — Discharge Instructions (Addendum)
You have been seen in the Emergency Department (ED) for a headache.   ° °As we have discussed, please follow up with your primary care doctor as soon as possible regarding today?s Emergency Department (ED) visit and your headache symptoms.   ° °Call your doctor or return to the ED if you have a worsening headache, sudden and severe headache, confusion, slurred speech, facial droop, weakness or numbness in any arm or leg, extreme fatigue, vision problems, or other symptoms that concern you. ° °

## 2021-02-12 NOTE — ED Provider Notes (Signed)
[facility] Emergency Department Provider Note ____________________________________________   Event Date/Time   First MD Initiated Contact with Patient 02/12/21 1237     (approximate)  I have reviewed the triage vital signs and the nursing notes.   HISTORY  Chief Complaint Headache and Visual Field Change    HPI Karla Moody is a 40 y.o. female reports no cerebrovascular medical history other than previous pregnancies, 2 children, and remote history of migraines  Today she was in her normal health risk at her home when at about 10 AM she suddenly started to notice a strange change in her left upper field of vision.  She reports she felt like a strange sparkly and rainbow started to appear.  It lasted for maybe a few minutes time and then shortly thereafter she started to get a throbbing right-sided headache.  This then became moderate in intensity.  It worsened over time.  Then associates that she started feeling tingling and numbness in her left hand and left forearm.  She discussed with her husband, and came to the ER for further evaluation.  Reports that the vision changes as well as the numbness in the left hand have completely resolved.  She still has a mild throbbing right-sided headache and is noticed a little bit of sensitivity to light  No fevers or chills no recent illness no neck pain.  The headache did not come on suddenly but was rather worsening gradually after visual symptoms receded   Past Medical History:  Diagnosis Date   Advanced maternal age in multigravida     There are no problems to display for this patient.   Past Surgical History:  Procedure Laterality Date   WISDOM TOOTH EXTRACTION  2017   All four;      Prior to Admission medications   Medication Sig Start Date End Date Taking? Authorizing Provider  Nutritional Supplements (JUICE PLUS FIBRE PO) Take by mouth.    [provider]  tamsulosin (FLOMAX) 0.4 MG  CAPS capsule Take 1 capsule (0.4 mg total) by mouth daily. 05/03/20   Belinda Fisher, PA-C    Allergies Patient has no known allergies.  Family History  Problem Relation Age of Onset   Diabetes Father    Heart disease Father        sudden death   Heart disease Paternal Grandfather    Multiple sclerosis Paternal Aunt     Social History Social History   Tobacco Use   Smoking status: Never   Smokeless tobacco: Never  Vaping Use   Vaping Use: Never used  Substance Use Topics   Alcohol use: Yes    Comment: occ wine   Drug use: Never    Review of Systems Constitutional: No fever/chills Eyes: See HPI ENT: No sore throat. Cardiovascular: Denies chest pain. Respiratory: Denies shortness of breath. Gastrointestinal: No abdominal pain.  No nausea vomiting Genitourinary: Negative for dysuria.  Does not believe she is pregnant, but also reports that she should probably have a urine pregnancy test checked if needed Musculoskeletal: Negative for back pain. Skin: Negative for rash. Neurological: See HPI    ____________________________________________   PHYSICAL EXAM:  VITAL SIGNS: ED Triage Vitals  Enc Vitals Group     BP 02/12/21 1046 113/68     Pulse Rate 02/12/21 1046 68     Resp 02/12/21 1046 18     Temp 02/12/21 1046 98.3 F (36.8 C)     Temp Source 02/12/21 1046 Oral  SpO2 02/12/21 1046 100 %     Weight 02/12/21 1047 210 lb (95.3 kg)     Height 02/12/21 1047 5\' 8"  (1.727 m)     Head Circumference --      Peak Flow --      Pain Score 02/12/21 1046 4     Pain Loc --      Pain Edu? --      Excl. in GC? --     Constitutional: Alert and oriented. Well appearing and in no acute distress.  Ambulates with normal and stable gait without ataxia Eyes: Conjunctivae are normal.  Pupils round equal reactive to light.  Normal visual fields right reports resolution of all visual changes that were present earlier Head: Atraumatic. Nose: No  congestion/rhinnorhea. Mouth/Throat: Mucous membranes are moist. Neck: No stridor.  Cardiovascular: Normal rate, regular rhythm. Grossly normal heart sounds.  Good peripheral circulation. Respiratory: Normal respiratory effort.  No retractions. Lungs CTAB. Gastrointestinal: Soft and nontender. No distention. Musculoskeletal: No lower extremity tenderness nor edema. Neurologic:  Normal speech and language. No gross focal neurologic deficits are appreciated.  Symmetric facial smile.  Normal sensation in all extremities.  Walks with normal gait.  No pronator drift in any extremity.  Reports normal sensation across the forearms hands bilaterally.  No motor weakness in the hands bilateral. Skin:  Skin is warm, dry and intact. No rash noted. Psychiatric: Mood and affect are normal. Speech and behavior are normal.  ____________________________________________   LABS (all labs ordered are listed, but only abnormal results are displayed)  Labs Reviewed  CBC  COMPREHENSIVE METABOLIC PANEL  POC URINE PREG, ED   ____________________________________________  EKG   ____________________________________________  RADIOLOGY  CT HEAD WO CONTRAST (04/14/21)  Result Date: 02/12/2021 CLINICAL DATA:  Neuro deficit, acute, stroke suspected EXAM: CT HEAD WITHOUT CONTRAST TECHNIQUE: Contiguous axial images were obtained from the base of the skull through the vertex without intravenous contrast. COMPARISON:  None. FINDINGS: Brain: No evidence of acute large vascular territory infarction, hemorrhage, hydrocephalus, extra-axial collection or mass lesion/mass effect. Vascular: No hyperdense vessel identified. Skull: No acute fracture. Sinuses/Orbits: Right maxillary sinus retention cyst versus polyp, incompletely imaged. Otherwise, visualized sinuses are clear. Unremarkable orbits. Other: No mastoid effusions. IMPRESSION: No evidence of acute intracranial abnormality. Electronically Signed   By: 04/14/2021 MD    On: 02/12/2021 11:13     CT head reviewed negative for acute.  In particular no evidence of intracranial hemorrhage ____________________________________________   PROCEDURES  Procedure(s) performed: None  Procedures  Critical Care performed: No  ____________________________________________   INITIAL IMPRESSION / ASSESSMENT AND PLAN / ED COURSE  Pertinent labs & imaging results that were available during my care of the patient were reviewed by me and considered in my medical decision making (see chart for details).   The patient's symptoms of headache seem to be pathognomonic of migraine.  Classic limits presentation.  Resolution of all neurologic symptoms at this time except for mild ongoing headache.  Will treat with fluids, magnesium, as well as Toradol if pregnancy test negative   Given her associated neurologic symptoms of numbness on the left side that have now resolved, will also administer aspirin.  Differential diagnosis includes, but is not limited to, intracranial hemorrhage, meningitis/encephalitis, previous head trauma, cavernous venous thrombosis, tension headache, temporal arteritis, migraine or migraine equivalent, idiopathic intracranial hypertension, and non-specific headache.  No infectious symptoms.  No history of trauma.  Clinical exam consistent with migraine with resolution of focal neurologic deficits at  this time.   Clinical Course as of 02/12/21 1541  Sun Feb 12, 2021  1335 CBC metabolic panel reviewed normal [MQ]    Clinical Course User Index [MQ] Sharyn Creamer, MD   ----------------------------------------- 3:41 PM on 02/12/2021 -----------------------------------------  Patient reports headache completely resolved no further symptoms.  Husband here and will be driving her home.  Discussed careful return precautions as well as recommendation to follow-up with neurology.  She does not currently have a primary care physician.  Return precautions and  treatment recommendations and follow-up discussed with the patient who is agreeable with the plan.  Patient reports completely asymptomatic.  ____________________________________________   FINAL CLINICAL IMPRESSION(S) / ED DIAGNOSES  Final diagnoses:  Migraine with aura and without status migrainosus, not intractable        Note:  This document was prepared using Dragon voice recognition software and may include unintentional dictation errors       Sharyn Creamer, MD 02/12/21 1543

## 2021-02-12 NOTE — ED Triage Notes (Addendum)
Pt comes pov with headache, left sided vision changes and numbness in left arm starting about 30 mins ago. The tingling and numbness and vision changes have since subsided but the headache remains. Describes episode of vision being colorful and prism-like on left side.

## 2021-02-16 DIAGNOSIS — F330 Major depressive disorder, recurrent, mild: Secondary | ICD-10-CM | POA: Diagnosis not present

## 2021-02-23 DIAGNOSIS — F330 Major depressive disorder, recurrent, mild: Secondary | ICD-10-CM | POA: Diagnosis not present

## 2021-03-02 DIAGNOSIS — F330 Major depressive disorder, recurrent, mild: Secondary | ICD-10-CM | POA: Diagnosis not present

## 2021-03-07 DIAGNOSIS — F330 Major depressive disorder, recurrent, mild: Secondary | ICD-10-CM | POA: Diagnosis not present

## 2021-03-16 DIAGNOSIS — F330 Major depressive disorder, recurrent, mild: Secondary | ICD-10-CM | POA: Diagnosis not present

## 2021-03-21 DIAGNOSIS — F330 Major depressive disorder, recurrent, mild: Secondary | ICD-10-CM | POA: Diagnosis not present

## 2021-03-30 DIAGNOSIS — F330 Major depressive disorder, recurrent, mild: Secondary | ICD-10-CM | POA: Diagnosis not present

## 2021-04-13 DIAGNOSIS — F330 Major depressive disorder, recurrent, mild: Secondary | ICD-10-CM | POA: Diagnosis not present

## 2021-04-27 DIAGNOSIS — F330 Major depressive disorder, recurrent, mild: Secondary | ICD-10-CM | POA: Diagnosis not present

## 2021-05-04 DIAGNOSIS — F330 Major depressive disorder, recurrent, mild: Secondary | ICD-10-CM | POA: Diagnosis not present

## 2021-05-11 DIAGNOSIS — F330 Major depressive disorder, recurrent, mild: Secondary | ICD-10-CM | POA: Diagnosis not present

## 2021-05-16 DIAGNOSIS — F330 Major depressive disorder, recurrent, mild: Secondary | ICD-10-CM | POA: Diagnosis not present

## 2021-05-25 DIAGNOSIS — F330 Major depressive disorder, recurrent, mild: Secondary | ICD-10-CM | POA: Diagnosis not present

## 2021-06-06 DIAGNOSIS — F330 Major depressive disorder, recurrent, mild: Secondary | ICD-10-CM | POA: Diagnosis not present

## 2021-06-15 DIAGNOSIS — F330 Major depressive disorder, recurrent, mild: Secondary | ICD-10-CM | POA: Diagnosis not present

## 2021-06-22 DIAGNOSIS — F330 Major depressive disorder, recurrent, mild: Secondary | ICD-10-CM | POA: Diagnosis not present

## 2021-06-29 DIAGNOSIS — F330 Major depressive disorder, recurrent, mild: Secondary | ICD-10-CM | POA: Diagnosis not present

## 2022-03-14 ENCOUNTER — Encounter: Payer: Self-pay | Admitting: Podiatry

## 2022-03-21 IMAGING — CT CT HEAD W/O CM
3 series · 16 of 47 positions shown, 19 images · non-contrast
Comparison: None.

CLINICAL DATA: Neuro deficit, acute, stroke suspected

EXAM:
CT HEAD WITHOUT CONTRAST
TECHNIQUE: Contiguous axial images were obtained from the base of the skull
through the vertex without intravenous contrast.

[Series 2: head wo · axial · 0.43mm/px · z∈[-129,+1]mm · 10 of 32 slices shown, 13 images]
[im 3/32  brain]
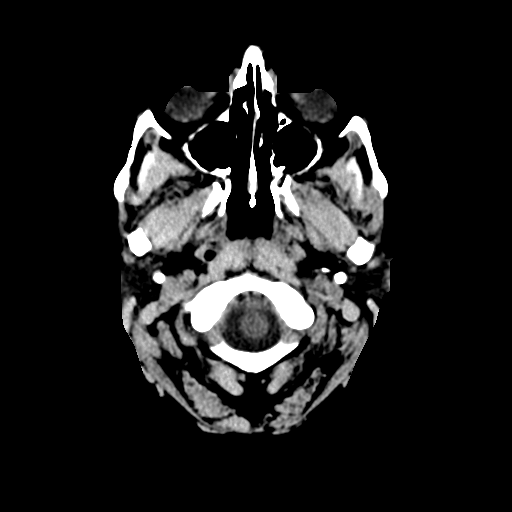
[im 3/32  bone]
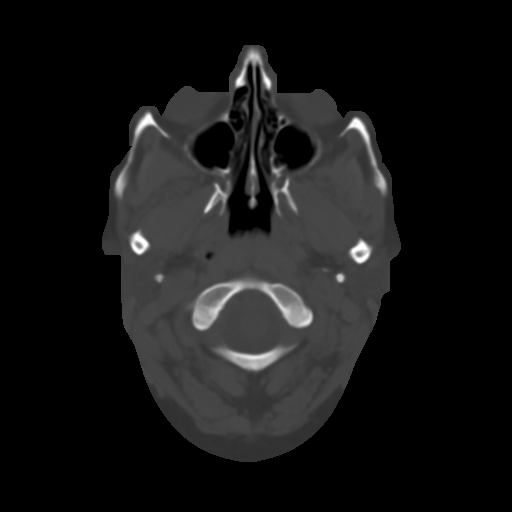
[im 6/32  brain]
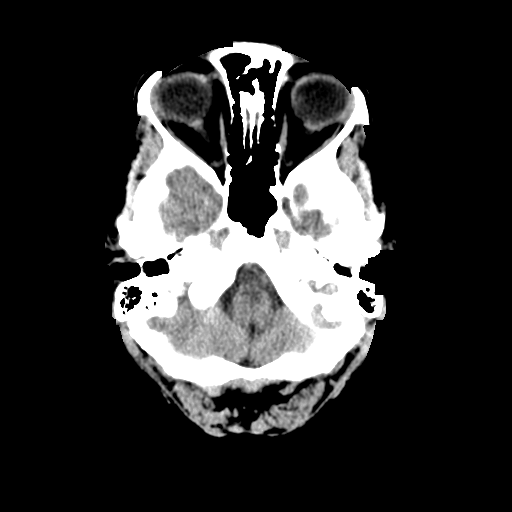
[im 9/32  brain]
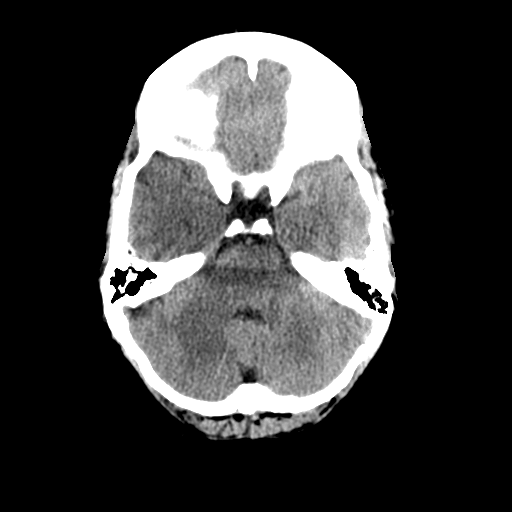
[im 11/32  brain]
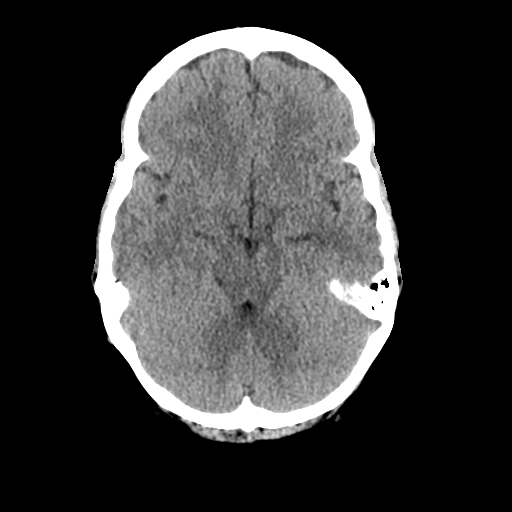
[im 14/32  brain]
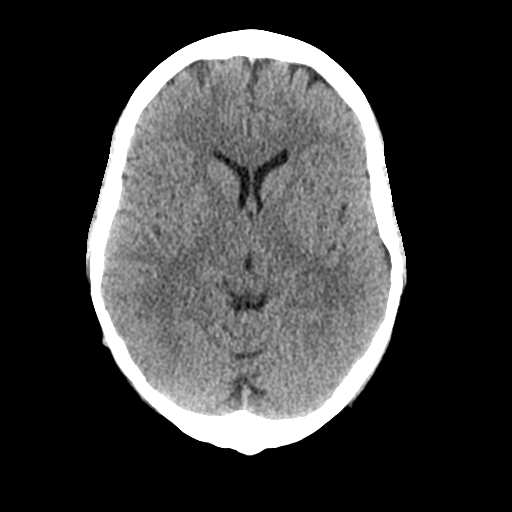
[im 14/32  bone]
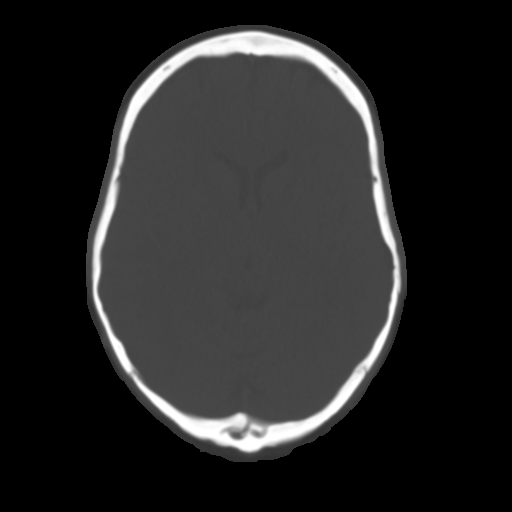
[im 18/32  brain]
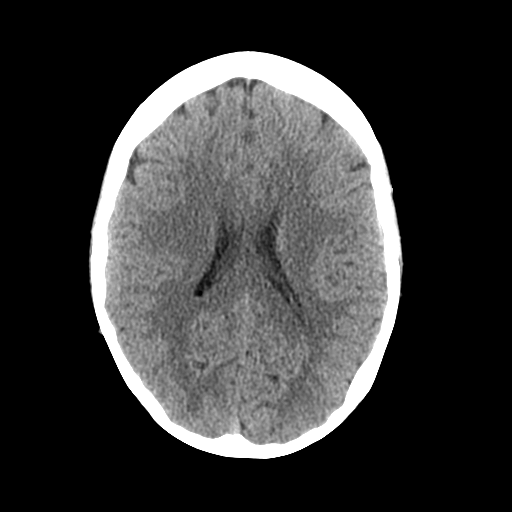
[im 21/32  brain]
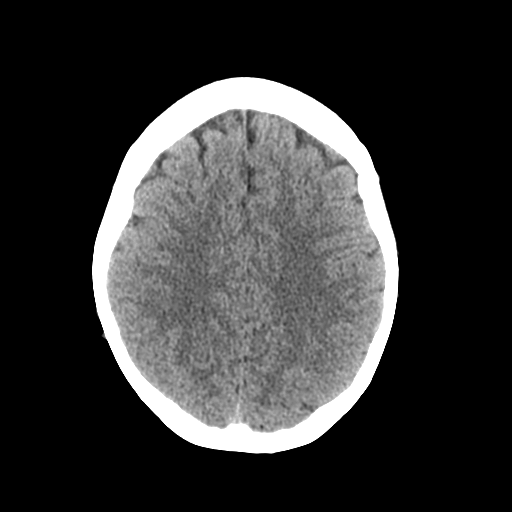
[im 24/32  brain]
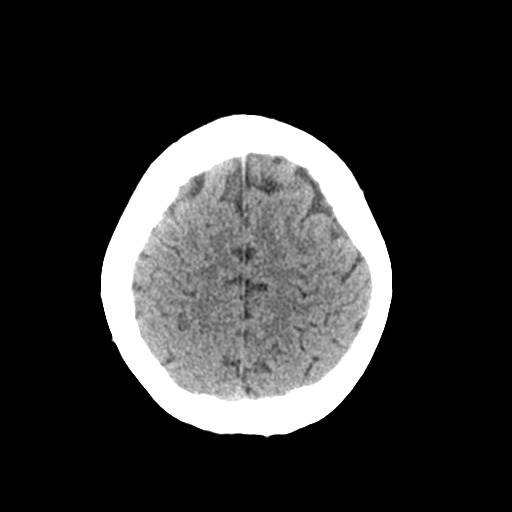
[im 26/32  brain]
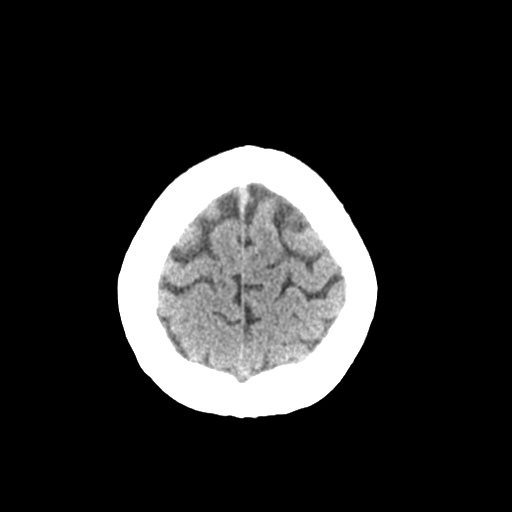
[im 26/32  bone]
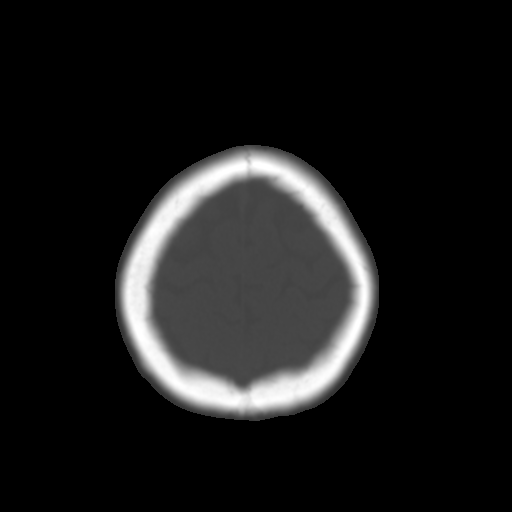
[im 29/32  brain]
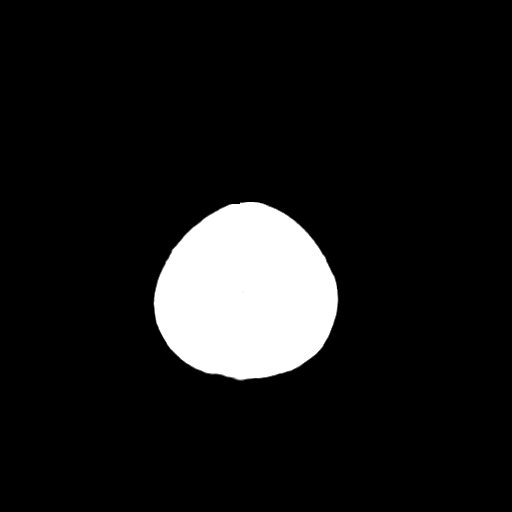

[Series 4: coronal soft tissue · coronal · 0.33mm/px · 3 of 65 slices shown]
[im 22/65  brain]
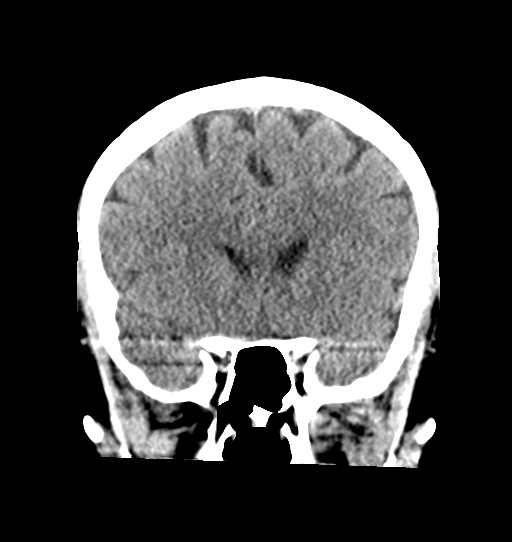
[im 29/65  brain]
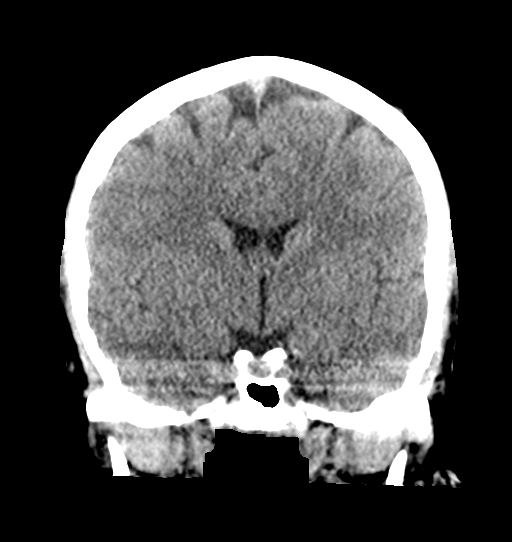
[im 36/65  brain]
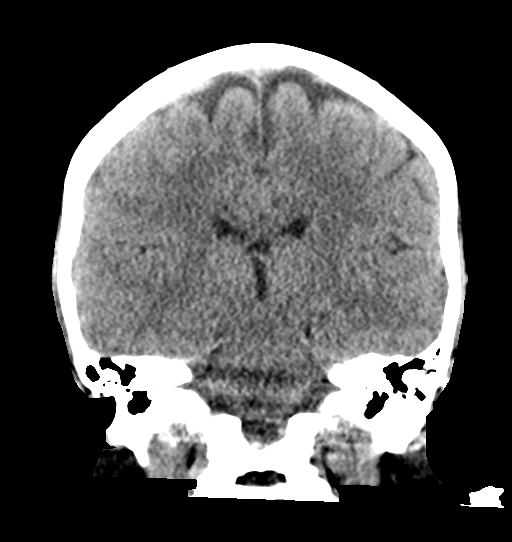

[Series 5: sagittal soft tissue · sagittal · 0.35mm/px · 3 of 57 slices shown]
[im 19/57  brain]
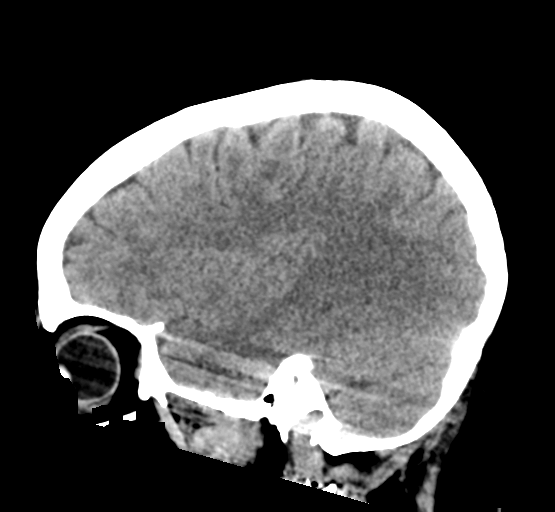
[im 29/57  brain]
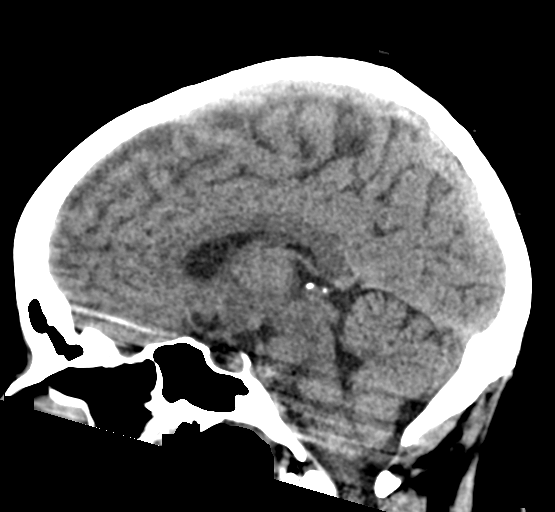
[im 38/57  brain]
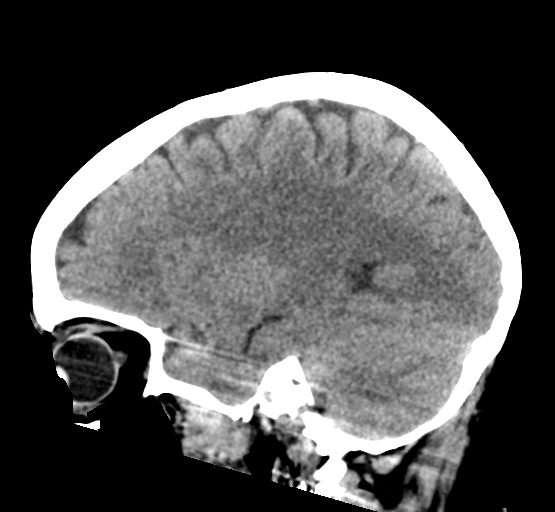

[16 of 47 positions shown; findings below may reference images not displayed]

FINDINGS: Brain: No evidence of acute large vascular territory infarction,
hemorrhage, hydrocephalus, extra-axial collection or mass
lesion/mass effect.

Vascular: No hyperdense vessel identified.

Skull: No acute fracture.

Sinuses/Orbits: Right maxillary sinus retention cyst versus polyp,
incompletely imaged. Otherwise, visualized sinuses are clear.
Unremarkable orbits.

Other: No mastoid effusions.
IMPRESSION: No evidence of acute intracranial abnormality.

## 2022-03-27 ENCOUNTER — Ambulatory Visit: Payer: BC Managed Care – PPO | Admitting: Podiatry

## 2022-03-27 DIAGNOSIS — Q667 Congenital pes cavus, unspecified foot: Secondary | ICD-10-CM

## 2022-03-27 DIAGNOSIS — Q6672 Congenital pes cavus, left foot: Secondary | ICD-10-CM | POA: Diagnosis not present

## 2022-03-27 DIAGNOSIS — M722 Plantar fascial fibromatosis: Secondary | ICD-10-CM | POA: Diagnosis not present

## 2022-03-27 DIAGNOSIS — R102 Pelvic and perineal pain: Secondary | ICD-10-CM | POA: Insufficient documentation

## 2022-03-27 NOTE — Progress Notes (Signed)
  Subjective:  Patient ID: Karla Moody, female    DOB: Mar 08, 1981,  MRN: [id number]  Chief Complaint  Patient presents with   Foot Pain    Pt stated that she has a burning sensation in both feet     41 y.o. female presents with the above complaint.  Patient presents with generalized heel pain.  Patient states is very mild in nature.  She notices it more when she is on uneven surfaces.  She does not want any invasive procedures she wanted to discuss other conservative care options that are available.  She states that it could be more Planter fasciitis pain scale is like 2 out of 10.  She has high tolerance for pain.  She has not seen anyone else prior to seeing me.  Denies any other acute complaints.   Review of Systems: Negative except as noted in the HPI. Denies N/V/F/Ch.  Past Medical History:  Diagnosis Date   Advanced maternal age in multigravida     Current Outpatient Medications:    Nutritional Supplements (JUICE PLUS FIBRE PO), Take by mouth., Disp: , Rfl:   Social History   Tobacco Use  Smoking Status Never  Smokeless Tobacco Never    No Known Allergies Objective:  There were no vitals filed for this visit. There is no height or weight on file to calculate BMI. Constitutional Well developed. Well nourished.  Vascular Dorsalis pedis pulses palpable bilaterally. Posterior tibial pulses palpable bilaterally. Capillary refill normal to all digits.  No cyanosis or clubbing noted. Pedal hair growth normal.  Neurologic Normal speech. Oriented to person, place, and time. Epicritic sensation to light touch grossly present bilaterally.  Dermatologic Nails well groomed and normal in appearance. No open wounds. No skin lesions.  Orthopedic: Normal joint ROM without pain or crepitus bilaterally. No visible deformities. Tender to palpation at the calcaneal tuber bilaterally. No pain with calcaneal squeeze bilaterally. Ankle ROM full range of motion, diminished range of  motion bilaterally. Silfverskiold Test: positive bilaterally.   Radiographs: None  Assessment:   1. Pes cavus   2. Plantar fasciitis    Plan:  Patient was evaluated and treated and all questions answered.  Plantar Fasciitis, bilaterally -I explained to the patient that given her pain is very mild in nature I believe she may just benefit from shoe gear modification orthotics management.  Patient agrees with the plan and would like to focus on that.  If it continues to worsen she will come back and see me for injection in the future.  Pes cavus -I explained to patient the etiology of pes planovalgus and relationship with Planter fasciitis and various treatment options were discussed.  Given patient foot structure in the setting of Planter fasciitis I believe patient will benefit from custom-made orthotics to help control the hindfoot motion support the arch of the foot and take the stress away from plantar fascial.  Patient agrees with the plan like to proceed with orthotics -Patient was casted for orthotics     No follow-ups on file.

## 2022-04-12 ENCOUNTER — Ambulatory Visit: Payer: BC Managed Care – PPO | Admitting: Podiatry

## 2022-04-12 DIAGNOSIS — M722 Plantar fascial fibromatosis: Secondary | ICD-10-CM

## 2022-04-12 DIAGNOSIS — Q667 Congenital pes cavus, unspecified foot: Secondary | ICD-10-CM

## 2022-04-12 NOTE — Progress Notes (Signed)
Orthotics are dispensed and functioning well.  No acute issues.

## 2023-05-31 ENCOUNTER — Ambulatory Visit
Admission: RE | Admit: 2023-05-31 | Discharge: 2023-05-31 | Disposition: A | Discharge status: Home / Self Care | Payer: BC Managed Care – PPO | Source: Ambulatory Visit | Attending: Emergency Medicine | Admitting: Emergency Medicine

## 2023-05-31 DIAGNOSIS — J069 Acute upper respiratory infection, unspecified: Secondary | ICD-10-CM | POA: Diagnosis not present

## 2023-05-31 MED ORDER — AMOXICILLIN-POT CLAVULANATE 875-125 MG PO TABS: 1.0000 | ORAL_TABLET | Freq: Two times a day (BID) | ORAL | 0 refills | Status: AC

## 2023-05-31 NOTE — ED Triage Notes (Signed)
Triaged by provider  

## 2023-05-31 NOTE — Discharge Instructions (Signed)
Symptoms present for 1 month and therefore there is most likely bacteria in the upper airways causing symptoms to linger  Begin Augmentin every morning and every evening for 10 days to clear germs    You can take Tylenol and/or Ibuprofen as needed for fever reduction and pain relief.   For cough: honey 1/2 to 1 teaspoon (you can dilute the honey in water or another fluid).  You can also use guaifenesin and dextromethorphan for cough. You can use a humidifier for chest congestion and cough.  If you don't have a humidifier, you can sit in the bathroom with the hot shower running.      For sore throat: try warm salt water gargles, cepacol lozenges, throat spray, warm tea or water with lemon/honey, popsicles or ice, or OTC cold relief medicine for throat discomfort.   For congestion: take a daily anti-histamine like Zyrtec, Claritin, and a oral decongestant, such as pseudoephedrine.  You can also use Flonase 1-2 sprays in each nostril daily.   It is important to stay hydrated: drink plenty of fluids (water, gatorade/powerade/pedialyte, juices, or teas) to keep your throat moisturized and help further relieve irritation/discomfort.

## 2023-05-31 NOTE — ED Provider Notes (Signed)
[name]    CSN: [id number] Arrival date & time: 05/31/23  2725      History   Chief Complaint Chief Complaint  Patient presents with   Cough    Had congestion, cough, and general sickness that is lingering. Want to make sure my lungs are clear. - Entered by patient    HPI Karla Moody is a 42 y.o. female.   Patient presents for evaluation of nasal congestion, rhinorrhea and a productive cough present for 1 month.  Symptoms worsening over the past 7 days with cough becoming more prominent.  Associated intermittent sinus pressure and rawness to the throat.  Tolerating food and liquids.  Known sick contacts prior.  Has attempted use of over-the-counter cold and flu medicine and cough drops which have been minimally effective.  Denies shortness of breath or wheezing.  Denies respiratory history. Past Medical History:  Diagnosis Date   Advanced maternal age in multigravida     Patient Active Problem List   Diagnosis Date Noted   Pelvic pain in female 03/27/2022    Past Surgical History:  Procedure Laterality Date   WISDOM TOOTH EXTRACTION  2017   All four;      OB History     Gravida  3   Para  2   Term  2   Preterm      AB      Living  2      SAB      IAB      Ectopic      Multiple      Live Births  2            Home Medications    Prior to Admission medications   Medication Sig Start Date End Date Taking? Authorizing Provider  amoxicillin-clavulanate (AUGMENTIN) 875-125 MG tablet Take 1 tablet by mouth every 12 (twelve) hours for 10 days. 05/31/23 06/10/23 Yes Girlie Veltri, Elita Boone, NP  Nutritional Supplements (JUICE PLUS FIBRE PO) Take by mouth.    [provider]    Family History Family History  Problem Relation Age of Onset   Diabetes Father    Heart disease Father        sudden death   Heart disease Paternal Grandfather    Multiple sclerosis Paternal Aunt     Social History Social History   Tobacco Use    Smoking status: Never   Smokeless tobacco: Never  Vaping Use   Vaping status: Never Used  Substance Use Topics   Alcohol use: Yes    Comment: occ wine   Drug use: Never     Allergies   Patient has no known allergies.   Review of Systems Review of Systems   Physical Exam Triage Vital Signs ED Triage Vitals  Encounter Vitals Group     BP      Systolic BP Percentile      Diastolic BP Percentile      Pulse      Resp      Temp      Temp src      SpO2      Weight      Height      Head Circumference      Peak Flow      Pain Score      Pain Loc      Pain Education      Exclude from Growth Chart    No data found.  Updated Vital Signs There  were no vitals taken for this visit.  Visual Acuity Right Eye Distance:   Left Eye Distance:   Bilateral Distance:    Right Eye Near:   Left Eye Near:    Bilateral Near:     Physical Exam Constitutional:      Appearance: Normal appearance.  HENT:     Head: Normocephalic.     Right Ear: Tympanic membrane, ear canal and external ear normal.     Left Ear: Tympanic membrane, ear canal and external ear normal.     Nose: Congestion present. No rhinorrhea.     Mouth/Throat:     Mouth: Mucous membranes are moist.     Pharynx: Oropharynx is clear.  Eyes:     Extraocular Movements: Extraocular movements intact.  Cardiovascular:     Rate and Rhythm: Normal rate and regular rhythm.     Pulses: Normal pulses.     Heart sounds: Normal heart sounds.  Pulmonary:     Effort: Pulmonary effort is normal.     Breath sounds: Normal breath sounds.  Skin:    General: Skin is warm and dry.  Neurological:     Mental Status: She is alert and oriented to person, place, and time. Mental status is at baseline.      UC Treatments / Results  Labs (all labs ordered are listed, but only abnormal results are displayed) Labs Reviewed - No data to display  EKG   Radiology No results found.  Procedures Procedures (including  critical care time)  Medications Ordered in UC Medications - No data to display  Initial Impression / Assessment and Plan / UC Course  I have reviewed the triage vital signs and the nursing notes.  Pertinent labs & imaging results that were available during my care of the patient were reviewed by me and considered in my medical decision making (see chart for details).  Acute URI  Patient is in no signs of distress nor toxic appearing.  Vital signs are stable.  Low suspicion for pneumonia, pneumothorax or bronchitis and therefore will defer imaging.  Viral testing deferred due to timeline of illness.  Prescribed Augmentin.May use additional over-the-counter medications as needed for supportive care.  May follow-up with urgent care as needed if symptoms persist or worsen.  Final Clinical Impressions(s) / UC Diagnoses   Final diagnoses:  Acute URI     Discharge Instructions      Symptoms present for 1 month and therefore there is most likely bacteria in the upper airways causing symptoms to linger  Begin Augmentin every morning and every evening for 10 days to clear germs    You can take Tylenol and/or Ibuprofen as needed for fever reduction and pain relief.   For cough: honey 1/2 to 1 teaspoon (you can dilute the honey in water or another fluid).  You can also use guaifenesin and dextromethorphan for cough. You can use a humidifier for chest congestion and cough.  If you don't have a humidifier, you can sit in the bathroom with the hot shower running.      For sore throat: try warm salt water gargles, cepacol lozenges, throat spray, warm tea or water with lemon/honey, popsicles or ice, or OTC cold relief medicine for throat discomfort.   For congestion: take a daily anti-histamine like Zyrtec, Claritin, and a oral decongestant, such as pseudoephedrine.  You can also use Flonase 1-2 sprays in each nostril daily.   It is important to stay hydrated: drink plenty of fluids (water,  gatorade/powerade/pedialyte, juices, or teas) to keep your throat moisturized and help further relieve irritation/discomfort.    ED Prescriptions     Medication Sig Dispense Auth. Provider   amoxicillin-clavulanate (AUGMENTIN) 875-125 MG tablet Take 1 tablet by mouth every 12 (twelve) hours for 10 days. 20 tablet Valinda Hoar, NP      PDMP not reviewed this encounter.   Valinda Hoar, NP 05/31/23 1011
